# Patient Record
Sex: Male | Born: 1975 | Race: White | Hispanic: No | Marital: Married | Smoking: Never smoker
Health system: Southern US, Community
[De-identification: ages and names within clinical notes are randomized; demographics above are authoritative.]

## PROBLEM LIST (undated history)

## (undated) DIAGNOSIS — Z803 Family history of malignant neoplasm of breast: Secondary | ICD-10-CM

## (undated) DIAGNOSIS — Z8042 Family history of malignant neoplasm of prostate: Secondary | ICD-10-CM

## (undated) DIAGNOSIS — K219 Gastro-esophageal reflux disease without esophagitis: Secondary | ICD-10-CM

## (undated) DIAGNOSIS — Z800 Family history of malignant neoplasm of digestive organs: Secondary | ICD-10-CM

## (undated) HISTORY — DX: Family history of malignant neoplasm of prostate: Z80.42

## (undated) HISTORY — PX: VASECTOMY: SHX75

## (undated) HISTORY — DX: Family history of malignant neoplasm of breast: Z80.3

## (undated) HISTORY — DX: Family history of malignant neoplasm of digestive organs: Z80.0

## (undated) HISTORY — PX: OTHER SURGICAL HISTORY: SHX169

---

## 2002-08-16 ENCOUNTER — Ambulatory Visit (HOSPITAL_BASED_OUTPATIENT_CLINIC_OR_DEPARTMENT_OTHER): Admission: RE | Admit: 2002-08-16 | Discharge: 2002-08-16 | Payer: Self-pay | Admitting: Otolaryngology

## 2021-11-01 ENCOUNTER — Other Ambulatory Visit: Payer: Self-pay | Admitting: Urology

## 2021-11-01 DIAGNOSIS — R972 Elevated prostate specific antigen [PSA]: Secondary | ICD-10-CM

## 2021-11-20 ENCOUNTER — Other Ambulatory Visit: Payer: Self-pay

## 2021-11-20 ENCOUNTER — Ambulatory Visit
Admission: RE | Admit: 2021-11-20 | Discharge: 2021-11-20 | Disposition: A | Discharge status: Home / Self Care | Payer: Self-pay | Source: Ambulatory Visit | Attending: Urology | Admitting: Urology

## 2021-11-20 DIAGNOSIS — R972 Elevated prostate specific antigen [PSA]: Secondary | ICD-10-CM

## 2021-11-20 MED ORDER — GADOBENATE DIMEGLUMINE 529 MG/ML IV SOLN
20.0000 mL | Freq: Once | INTRAVENOUS | Status: AC | PRN
  Administered 2021-11-20: 20 mL via INTRAVENOUS

## 2021-12-24 ENCOUNTER — Ambulatory Visit: Payer: Self-pay | Admitting: Nurse Practitioner

## 2021-12-28 ENCOUNTER — Other Ambulatory Visit (HOSPITAL_COMMUNITY): Payer: Self-pay | Admitting: Urology

## 2021-12-28 DIAGNOSIS — C61 Malignant neoplasm of prostate: Secondary | ICD-10-CM

## 2022-01-07 ENCOUNTER — Other Ambulatory Visit (HOSPITAL_COMMUNITY): Payer: Self-pay | Admitting: Urology

## 2022-01-07 DIAGNOSIS — C61 Malignant neoplasm of prostate: Secondary | ICD-10-CM

## 2022-01-11 ENCOUNTER — Encounter (HOSPITAL_COMMUNITY): Payer: BC Managed Care – PPO

## 2022-01-13 ENCOUNTER — Encounter (HOSPITAL_COMMUNITY)
Admission: RE | Admit: 2022-01-13 | Discharge: 2022-01-13 | Disposition: A | Discharge status: Home / Self Care | Payer: BC Managed Care – PPO | Source: Ambulatory Visit | Attending: Urology | Admitting: Urology

## 2022-01-13 DIAGNOSIS — C61 Malignant neoplasm of prostate: Secondary | ICD-10-CM | POA: Insufficient documentation

## 2022-01-13 MED ORDER — TECHNETIUM TC 99M MEDRONATE IV KIT
20.0000 | PACK | Freq: Once | INTRAVENOUS | Status: AC | PRN
  Administered 2022-01-13: 21 via INTRAVENOUS

## 2022-01-24 ENCOUNTER — Other Ambulatory Visit: Payer: Self-pay | Admitting: Urology

## 2022-01-25 ENCOUNTER — Other Ambulatory Visit: Payer: Self-pay | Admitting: Urology

## 2022-02-25 NOTE — Progress Notes (Addendum)
Anesthesia Review:  PCP:  DR Janace Litten- Lov 07/27/21  Cardiologist : Chest x-ray : EKG : Echo : Stress test: Cardiac Cath :  Activity level:  Sleep Study/ CPAP : Fasting Blood Sugar :      / Checks Blood Sugar -- times a day:   Blood Thinner/ Instructions /Last Dose: ASA / Instructions/ Last Dose :   Phentermine - last dose 02/28/22 per pt

## 2022-02-28 NOTE — Progress Notes (Signed)
DUE TO COVID-19 ONLY  2  VISITOR IS ALLOWED TO COME WITH YOU AND STAY IN THE WAITING ROOM ONLY DURING PRE OP AND PROCEDURE DAY OF SURGERY.   4 VISITOR  MAY VISIT WITH YOU AFTER SURGERY IN YOUR PRIVATE ROOM DURING VISITING HOURS ONLY! YOU MAY HAVE ONE PERSON SPEND THE NITE WITH YOU IN YOUR ROOM AFTER SURGERY.    .    Your procedure is scheduled on:          03/10/2022   Report to [facility] Main  Entrance   Report to admitting at     0515             AM DO NOT [place], PICTURE ID OR WALLET DAY OF SURGERY.      Call this number if you have problems the morning of surgery [phone]    Clear liquid diet the day before surgery.   17 grams of Miralax in 4 ounces of water at 12 noon day before surgeyr.   Fleets enema nite before surgery.     REMEMBER: NO  SOLID FOODS , CANDY, GUM OR MINTS AFTER MIDNITE THE NITE BEFORE SURGERY .       Marland Kitchen CLEAR LIQUIDS UNTIL   0415             DAY OF SURGERY.         CLEAR LIQUID DIET   Foods Allowed      WATER BLACK COFFEE ( SUGAR OK, NO MILK, CREAM OR CREAMER) REGULAR AND DECAF  TEA ( SUGAR OK NO MILK, CREAM, OR CREAMER) REGULAR AND DECAF  PLAIN JELLO ( NO RED)  FRUIT ICES ( NO RED, NO FRUIT PULP)  POPSICLES ( NO RED)  JUICE- APPLE, WHITE GRAPE AND WHITE CRANBERRY  SPORT DRINK LIKE GATORADE ( NO RED)  CLEAR BROTH ( VEGETABLE , CHICKEN OR BEEF)                                                                     BRUSH YOUR TEETH MORNING OF SURGERY AND RINSE YOUR MOUTH OUT, NO CHEWING GUM CANDY OR MINTS.     Take these medicines the morning of surgery with A SIP OF WATER:  omeprazole    DO NOT TAKE ANY DIABETIC MEDICATIONS DAY OF YOUR SURGERY                               You may not have any metal on your body including hair pins and              piercings  Do not wear jewelry, make-up, lotions, powders or perfumes, deodorant             Do not wear nail polish on your fingernails.              IF YOU ARE A MALE AND  WANT TO SHAVE UNDER ARMS OR LEGS PRIOR TO SURGERY YOU MUST DO SO AT LEAST 48 HOURS PRIOR TO SURGERY.              Men may shave face and neck.   Do not bring valuables to the hospital. [facility] IS NOT  RESPONSIBLE   FOR VALUABLES.  Contacts, dentures or bridgework may not be worn into surgery.  Leave suitcase in the car. After surgery it may be brought to your room.     Patients discharged the day of surgery will not be allowed to drive home. IF YOU ARE HAVING SURGERY AND GOING HOME THE SAME DAY, YOU MUST HAVE AN ADULT TO DRIVE YOU HOME AND BE WITH YOU FOR 24 HOURS. YOU MAY GO HOME BY TAXI OR UBER OR ORTHERWISE, BUT AN ADULT MUST ACCOMPANY YOU HOME AND STAY WITH YOU FOR 24 HOURS.                Please read over the following fact sheets you were given: _____________________________________________________________________  [facility] - Preparing for Surgery Before surgery, you can play an important role.  Because skin is not sterile, your skin needs to be as free of germs as possible.  You can reduce the number of germs on your skin by washing with CHG (chlorahexidine gluconate) soap before surgery.  CHG is an antiseptic cleaner which kills germs and bonds with the skin to continue killing germs even after washing. Please DO NOT use if you have an allergy to CHG or antibacterial soaps.  If your skin becomes reddened/irritated stop using the CHG and inform your nurse when you arrive at Short Stay. Do not shave (including legs and underarms) for at least 48 hours prior to the first CHG shower.  You may shave your face/neck. Please follow these instructions carefully:  1.  Shower with CHG Soap the night before surgery and the  morning of Surgery.  2.  If you choose to wash your hair, wash your hair first as usual with your  normal  shampoo.  3.  After you shampoo, rinse your hair and body thoroughly to remove the  shampoo.                           4.  Use CHG as you would any other  liquid soap.  You can apply chg directly  to the skin and wash                       Gently with a scrungie or clean washcloth.  5.  Apply the CHG Soap to your body ONLY FROM THE NECK DOWN.   Do not use on face/ open                           Wound or open sores. Avoid contact with eyes, ears mouth and genitals (private parts).                       Wash face,  Genitals (private parts) with your normal soap.             6.  Wash thoroughly, paying special attention to the area where your surgery  will be performed.  7.  Thoroughly rinse your body with warm water from the neck down.  8.  DO NOT shower/wash with your normal soap after using and rinsing off  the CHG Soap.                9.  Pat yourself dry with a clean towel.            10.  Wear clean pajamas.  11.  Place clean sheets on your bed the night of your first shower and do not  sleep with pets. Day of Surgery : Do not apply any lotions/deodorants the morning of surgery.  Please wear clean clothes to the hospital/surgery center.  FAILURE TO FOLLOW THESE INSTRUCTIONS MAY RESULT IN THE CANCELLATION OF YOUR SURGERY PATIENT SIGNATURE_________________________________  NURSE SIGNATURE__________________________________  ________________________________________________________________________

## 2022-03-01 ENCOUNTER — Encounter (HOSPITAL_COMMUNITY): Payer: Self-pay

## 2022-03-01 ENCOUNTER — Other Ambulatory Visit: Payer: Self-pay

## 2022-03-01 ENCOUNTER — Encounter (HOSPITAL_COMMUNITY)
Admission: RE | Admit: 2022-03-01 | Discharge: 2022-03-01 | Disposition: A | Discharge status: Home / Self Care | Payer: BC Managed Care – PPO | Source: Ambulatory Visit | Attending: Urology | Admitting: Urology

## 2022-03-01 VITALS — BP 143/95 | HR 75 | Temp 97.9°F | Resp 16 | Ht 68.0 in | Wt 202.0 lb

## 2022-03-01 DIAGNOSIS — Z01818 Encounter for other preprocedural examination: Secondary | ICD-10-CM

## 2022-03-01 DIAGNOSIS — Z01812 Encounter for preprocedural laboratory examination: Secondary | ICD-10-CM | POA: Insufficient documentation

## 2022-03-01 HISTORY — DX: Gastro-esophageal reflux disease without esophagitis: K21.9

## 2022-03-01 LAB — BASIC METABOLIC PANEL
Anion gap: 7 (ref 5–15)
BUN: 12 mg/dL (ref 6–20)
CO2: 25 mmol/L (ref 22–32)
Calcium: 9.1 mg/dL (ref 8.9–10.3)
Chloride: 109 mmol/L (ref 98–111)
Creatinine, Ser: 1.01 mg/dL (ref 0.61–1.24)
GFR, Estimated: >60 mL/min (ref >60)
Glucose, Bld: 90 mg/dL (ref 70–99)
Potassium: 4 mmol/L (ref 3.5–5.1)
Sodium: 141 mmol/L (ref 135–145)

## 2022-03-01 LAB — CBC
HCT: 40.7 % (ref 39.0–52.0)
Hemoglobin: 14.3 g/dL (ref 13.0–17.0)
MCH: 31.2 pg (ref 26.0–34.0)
MCHC: 35.1 g/dL (ref 30.0–36.0)
MCV: 88.9 fL (ref 80.0–100.0)
Platelets: 255 10*3/uL (ref 150–400)
RBC: 4.58 MIL/uL (ref 4.22–5.81)
RDW: 11.9 % (ref 11.5–15.5)
WBC: 6.4 10*3/uL (ref 4.0–10.5)
nRBC: 0 % (ref 0.0–0.2)

## 2022-03-09 NOTE — H&P (Signed)
Office Visit Report     03/01/2022   --------------------------------------------------------------------------------   Craig Welch. Shaff  MRN: [id number]  DOB: [birth year], 46 year old Male  SSN: [id number]   PRIMARY CARE:  Herbie Baltimore A. Unk Lightning, MD  REFERRING:  Audree Camel. Unk Lightning, MD  PROVIDER:  Raynelle Bring, M.D.  TREATING:  Jiles Crocker, NP  LOCATION:  [facility] [phone]     --------------------------------------------------------------------------------   CC/HPI: CC: Prostate Cancer   Physician requesting consult: Dr. Jacalyn Lefevre  PCP: Dr. Janace Litten   [name] is a 46 year old gentleman who presented to a men's health clinic due to fatigue. His testosterone was 350 but his PSA was noted to be elevated at 5.3. He went to his PCP and this was repeated and remained elevated at 5.32. He was seen by Dr. Claudia Desanctis and an MRI of the prostate was performed on 11/20/21 that demonstrated a small 5 mm PI-RADS lesion of the left mid prostate. 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   Family history: None.   Imaging studies:  MRI (11/20/21): No EPE, SVI, LAD, or bone lesions.  CT abdomen/pelvis (01/10/2022): No evidence of metastatic disease  Bone scan (01/13/2022): Pending   PMH: He has a history of GERD.  PSH: No abdominal surgeries.   TNM stage: cT1c N0 M0  PSA: 5.32  Gleason score: 4+4=8 (GG4)  Biopsy (12/15/21): 5/15 cores positive  Left: L lateral mid (30%, 4+3=7), L mid (50%, 4+4=8)  Right: Benign  Targeted: 3/3 cores positive (30%, 4+4=8 and 30%, 30%, 4+3=7)  Prostate volume: 18 cc   Nomogram  OC disease: 65%  EPE: 33%  SVI: 5%  LNI: 8%  PFS (5 year, 10 year): 61%, 45%   Urinary function: IPSS is 9.  Erectile function: SHIM score is 20.   03/01/2022: Here today for preoperative appointment prior to  undergoing bilateral nerve sparing robot-assisted laparoscopic radical prostatectomy and bilateral pelvic lymph node dissection on 03/10/2022 with Dr Alinda Money. Patient has been seen numerous times by pelvic floor physical therapy in anticipation of the procedure. He reports being diligent with the exercises instructed at home. Patient denies any changes in past medical history, prescription medications taken on daily basis, no interval surgical or procedural interventions. Patient does endorse quite a bit of stress and anxiety in relation to the upcoming procedure. Over the past month he reports some weakening of his force of stream. Not associated with any dysuria or gross hematuria. He also mentions having some intermittent dizziness, feeling flushed as well as having some nausea while he is working. He is trying to drink at least 64 ounces of water per day. Of note patient is taking phentermine for the past 2 months for weight loss. These current symptoms began a couple of weeks ago. He denies any specific chest pain, shortness of breath, numbness or tingling in the extremities. He is having normal daily bowel movements.     ALLERGIES: No Known Allergies    MEDICATIONS: Nexium  Phentermine Hcl 37.5 mg capsule  Valacyclovir     GU PSH: Locm 300-'399Mg'$ /Ml Iodine,1Ml - 01/10/2022 Prostate Needle Biopsy - 12/15/2021     NON-GU PSH: Surgical Pathology, Gross And Microscopic Examination For Prostate Needle - 12/15/2021 Tonsillectomy     GU PMH: Stress Incontinence - 02/01/2022 Urinary Frequency - 01/18/2022, - 12/24/2021, - 10/22/2021 Prostate Cancer -  01/11/2022, - 01/10/2022, - 12/24/2021 Elevated PSA - 01/10/2022, - 12/15/2021, - 10/22/2021      PMH Notes:   1) Prostate cancer: He is s/p a BNS RAL radical prostatectomy and BPLND on 03/10/22.   Diagnosis:  Pretreatment PSA: 5.32  Pretreatment SHIM score: 20   NON-GU PMH: Muscle weakness (generalized) - 02/01/2022, - 01/18/2022 Other muscle spasm -  02/01/2022, - 01/18/2022 Constipation, unspecified - 01/18/2022    FAMILY HISTORY: Colon Cancer - Father Dementia - Mother Diabetes - Father Hypertension - Father   SOCIAL HISTORY: Marital Status: Married Preferred Language: English; Ethnicity: Not Hispanic Or Latino; Race: White Drinks 3 caffeinated drinks per day.    REVIEW OF SYSTEMS:    GU Review Male:   Patient denies frequent urination, hard to postpone urination, burning/ pain with urination, get up at night to urinate, leakage of urine, stream starts and stops, trouble starting your stream, have to strain to urinate , erection problems, and penile pain.  Gastrointestinal (Upper):   Patient reports nausea. Patient denies vomiting and indigestion/ heartburn.  Gastrointestinal (Lower):   Patient denies diarrhea and constipation.  Constitutional:   Patient denies fever, night sweats, weight loss, and fatigue.  Skin:   Patient denies skin rash/ lesion and itching.  Eyes:   Patient denies blurred vision and double vision.  Ears/ Nose/ Throat:   Patient denies sore throat and sinus problems.  Hematologic/Lymphatic:   Patient denies swollen glands and easy bruising.  Cardiovascular:   Patient denies leg swelling and chest pains.  Respiratory:   Patient denies cough and shortness of breath.  Endocrine:   Patient denies excessive thirst.  Musculoskeletal:   Patient denies back pain and joint pain.  Neurological:   Patient reports dizziness. Patient denies headaches.  Psychologic:   Patient denies depression and anxiety.   VITAL SIGNS:      03/01/2022 10:18 AM  Weight 213 lb / 96.62 kg  Height 68 in / 172.72 cm  BP 133/79 mmHg  Pulse 91 /min  Temperature 98.0 F / 36.6 C  BMI 32.4 kg/m   MULTI-SYSTEM PHYSICAL EXAMINATION:    Constitutional: Well-nourished. No physical deformities. Normally developed. Good grooming.  Neck: Neck symmetrical, not swollen. Normal tracheal position.  Respiratory: Normal breath sounds. No labored  breathing, no use of accessory muscles.   Cardiovascular: Regular rate and rhythm. No murmur, no gallop. Normal temperature, normal extremity pulses, no swelling, no varicosities.   Skin: No paleness, no jaundice, no cyanosis. No lesion, no ulcer, no rash.  Neurologic / Psychiatric: Oriented to time, oriented to place, oriented to person. No depression, no anxiety, no agitation.  Gastrointestinal: No mass, no tenderness, no rigidity, non obese abdomen.  Musculoskeletal: Normal gait and station of head and neck.     Complexity of Data:  Source Of History:  Patient, Family/Caregiver, Medical Record Summary  Lab Test Review:   PSA  Records Review:   Pathology Reports, Previous Doctor Records, Previous Hospital Records, Previous Patient Records  Urine Test Review:   Urinalysis  X-Ray Review: C.T. Abdomen/Pelvis: Reviewed Films. Reviewed Report.  Bone Scan: Reviewed Report.     03/01/22  Urinalysis  Urine Appearance Clear   Urine Color Straw   Urine Glucose Neg mg/dL  Urine Bilirubin Neg mg/dL  Urine Ketones Neg mg/dL  Urine Specific Gravity <=1.005   Urine Blood Neg ery/uL  Urine pH 5.5   Urine Protein Neg mg/dL  Urine Urobilinogen 0.2 mg/dL  Urine Nitrites Neg   Urine Leukocyte Esterase Neg leu/uL  PROCEDURES:          Urinalysis Dipstick Dipstick Cont'd  Color: Straw Bilirubin: Neg mg/dL  Appearance: Clear Ketones: Neg mg/dL  Specific Gravity: <=1.005 Blood: Neg ery/uL  pH: 5.5 Protein: Neg mg/dL  Glucose: Neg mg/dL Urobilinogen: 0.2 mg/dL    Nitrites: Neg    Leukocyte Esterase: Neg leu/uL    ASSESSMENT:      ICD-10 Details  1 GU:   Prostate Cancer - C61 Chronic, Threat to Bodily Function  2 NON-GU:   Encounter for other preprocedural examination - Z01.818 Undiagnosed New Problem   PLAN:           Orders Labs Urine Culture          Schedule Return Visit/Planned Activity: Keep Scheduled Appointment - Follow up MD, Schedule Surgery          Document Letter(s):   Created for Patient: Clinical Summary         Notes:   Reassurance provided. He is taking the weight loss drug phentermine which I think coupled with some underlying anxiety in relation to the upcoming procedure is resulting in him having some dizziness and nausea while at work. I did encourage eating a healthy diet, increasing hydration, he can also drink Gatorade to replenish electrolytes. At this time he is going to stop that medication.   All questions answered to the best of my ability regarding the upcoming procedure and expected postoperative course with understanding expressed by the patient. Urine culture sent today to serve as a baseline. He will proceed with previously scheduled prostatectomy on 6/15 with his urologist.        Next Appointment:      Next Appointment: 03/10/2022 07:15 AM    Appointment Type: Surgery     Location: [facility] [phone]    Provider: Raynelle Bring, M.D.    Reason for Visit: WL/EXT REC RA LAP RAD PROSTATECTOMY LEVEL 2, BPLA WITH AMANDA      * Signed by Jiles Crocker, NP on 03/01/22 at 10:55 AM (EDT)*

## 2022-03-09 NOTE — Anesthesia Preprocedure Evaluation (Addendum)
Anesthesia Evaluation  Patient identified by MRN, date of birth, ID band Patient awake    Reviewed: Allergy & Precautions, NPO status , Patient's Chart, lab work & pertinent test results  Airway Mallampati: II  TM Distance: >3 FB Neck ROM: Full    Dental no notable dental hx.    Pulmonary neg pulmonary ROS,    Pulmonary exam normal breath sounds clear to auscultation       Cardiovascular hypertension, Normal cardiovascular exam Rhythm:Regular Rate:Normal  Untreated HTN   Neuro/Psych negative neurological ROS  negative psych ROS   GI/Hepatic Neg liver ROS, GERD  Medicated,  Endo/Other  negative endocrine ROS  Renal/GU negative Renal ROS  negative genitourinary   Musculoskeletal negative musculoskeletal ROS (+)   Abdominal   Peds negative pediatric ROS (+)  Hematology negative hematology ROS (+)   Anesthesia Other Findings   Reproductive/Obstetrics negative OB ROS                            Anesthesia Physical Anesthesia Plan  ASA: 3  Anesthesia Plan: General   Post-op Pain Management: Toradol IV (intra-op)*   Induction: Intravenous  PONV Risk Score and Plan: 2 and Ondansetron, Dexamethasone and Treatment may vary due to age or medical condition  Airway Management Planned: Oral ETT  Additional Equipment:   Intra-op Plan:   Post-operative Plan: Extubation in OR  Informed Consent: I have reviewed the patients History and Physical, chart, labs and discussed the procedure including the risks, benefits and alternatives for the proposed anesthesia with the patient or authorized representative who has indicated his/her understanding and acceptance.     Dental advisory given  Plan Discussed with: CRNA and Surgeon  Anesthesia Plan Comments:        Anesthesia Quick Evaluation

## 2022-03-10 ENCOUNTER — Other Ambulatory Visit: Payer: Self-pay

## 2022-03-10 ENCOUNTER — Encounter (HOSPITAL_COMMUNITY): Payer: Self-pay | Admitting: Urology

## 2022-03-10 ENCOUNTER — Observation Stay (HOSPITAL_COMMUNITY)
Admission: RE | Admit: 2022-03-10 | Discharge: 2022-03-11 | Disposition: A | Discharge status: Home / Self Care | Payer: BC Managed Care – PPO | Source: Ambulatory Visit | Attending: Urology | Admitting: Urology

## 2022-03-10 ENCOUNTER — Encounter (HOSPITAL_COMMUNITY)
Admission: RE | Disposition: A | Discharge status: Home / Self Care | Payer: Self-pay | Source: Ambulatory Visit | Attending: Urology

## 2022-03-10 ENCOUNTER — Ambulatory Visit (HOSPITAL_COMMUNITY): Payer: BC Managed Care – PPO | Admitting: Anesthesiology

## 2022-03-10 DIAGNOSIS — C61 Malignant neoplasm of prostate: Secondary | ICD-10-CM | POA: Diagnosis present

## 2022-03-10 DIAGNOSIS — Z01818 Encounter for other preprocedural examination: Secondary | ICD-10-CM

## 2022-03-10 HISTORY — PX: ROBOT ASSISTED LAPAROSCOPIC RADICAL PROSTATECTOMY: SHX5141

## 2022-03-10 HISTORY — PX: LYMPHADENECTOMY: SHX5960

## 2022-03-10 LAB — TYPE AND SCREEN
ABO/RH(D): O POS
Antibody Screen: NEGATIVE

## 2022-03-10 LAB — SURGICAL PCR SCREEN
MRSA, PCR: NEGATIVE
Staphylococcus aureus: NEGATIVE

## 2022-03-10 LAB — HEMOGLOBIN AND HEMATOCRIT, BLOOD
HCT: 41.2 % (ref 39.0–52.0)
Hemoglobin: 14.4 g/dL (ref 13.0–17.0)

## 2022-03-10 LAB — ABO/RH: ABO/RH(D): O POS

## 2022-03-10 SURGERY — XI ROBOTIC ASSISTED LAPAROSCOPIC RADICAL PROSTATECTOMY LEVEL 2
Anesthesia: General

## 2022-03-10 MED ORDER — LIDOCAINE HCL (PF) 2 % IJ SOLN
INTRAMUSCULAR | Status: AC
  Filled 2022-03-10: qty 5

## 2022-03-10 MED ORDER — LABETALOL HCL 5 MG/ML IV SOLN
INTRAVENOUS | Status: AC
  Filled 2022-03-10: qty 4

## 2022-03-10 MED ORDER — ROCURONIUM BROMIDE 10 MG/ML (PF) SYRINGE
PREFILLED_SYRINGE | INTRAVENOUS | Status: AC
  Filled 2022-03-10: qty 10

## 2022-03-10 MED ORDER — PHENYLEPHRINE 80 MCG/ML (10ML) SYRINGE FOR IV PUSH (FOR BLOOD PRESSURE SUPPORT)
PREFILLED_SYRINGE | INTRAVENOUS | Status: AC
Start: 2022-03-10 — End: ?
  Filled 2022-03-10: qty 10

## 2022-03-10 MED ORDER — PANTOPRAZOLE SODIUM 40 MG PO TBEC
40.0000 mg | DELAYED_RELEASE_TABLET | Freq: Every day | ORAL | Status: DC
  Administered 2022-03-11: 40 mg via ORAL
  Filled 2022-03-10: qty 1

## 2022-03-10 MED ORDER — DOCUSATE SODIUM 100 MG PO CAPS
100.0000 mg | ORAL_CAPSULE | Freq: Two times a day (BID) | ORAL | Status: DC
  Administered 2022-03-10 – 2022-03-11 (×3): 100 mg via ORAL
  Filled 2022-03-10 (×3): qty 1

## 2022-03-10 MED ORDER — CHLORHEXIDINE GLUCONATE 0.12 % MT SOLN
15.0000 mL | Freq: Once | OROMUCOSAL | Status: AC
  Administered 2022-03-10: 15 mL via OROMUCOSAL

## 2022-03-10 MED ORDER — MIDAZOLAM HCL 5 MG/5ML IJ SOLN
INTRAMUSCULAR | Status: DC | PRN
  Administered 2022-03-10: 2 mg via INTRAVENOUS

## 2022-03-10 MED ORDER — ONDANSETRON HCL 4 MG/2ML IJ SOLN: 4.0000 mg | INTRAMUSCULAR | Status: DC | PRN

## 2022-03-10 MED ORDER — DEXAMETHASONE SODIUM PHOSPHATE 10 MG/ML IJ SOLN
INTRAMUSCULAR | Status: AC
  Filled 2022-03-10: qty 1

## 2022-03-10 MED ORDER — DIPHENHYDRAMINE HCL 50 MG/ML IJ SOLN: 12.5000 mg | Freq: Four times a day (QID) | INTRAMUSCULAR | Status: DC | PRN

## 2022-03-10 MED ORDER — ZOLPIDEM TARTRATE 5 MG PO TABS: 5.0000 mg | ORAL_TABLET | Freq: Every evening | ORAL | Status: DC | PRN

## 2022-03-10 MED ORDER — HEPARIN SODIUM (PORCINE) 1000 UNIT/ML IJ SOLN
INTRAMUSCULAR | Status: AC
Start: 2022-03-10 — End: ?
  Filled 2022-03-10: qty 1

## 2022-03-10 MED ORDER — LIDOCAINE HCL 2 % IJ SOLN
INTRAMUSCULAR | Status: AC
  Filled 2022-03-10: qty 20

## 2022-03-10 MED ORDER — KETOROLAC TROMETHAMINE 15 MG/ML IJ SOLN
15.0000 mg | Freq: Four times a day (QID) | INTRAMUSCULAR | Status: DC
  Administered 2022-03-10 – 2022-03-11 (×4): 15 mg via INTRAVENOUS
  Filled 2022-03-10 (×4): qty 1

## 2022-03-10 MED ORDER — LABETALOL HCL 5 MG/ML IV SOLN
INTRAVENOUS | Status: DC | PRN
  Administered 2022-03-10: 5 mg via INTRAVENOUS
  Administered 2022-03-10: 10 mg via INTRAVENOUS
  Administered 2022-03-10: 5 mg via INTRAVENOUS

## 2022-03-10 MED ORDER — DOCUSATE SODIUM 100 MG PO CAPS: 100.0000 mg | ORAL_CAPSULE | Freq: Two times a day (BID) | ORAL | Status: DC

## 2022-03-10 MED ORDER — FLEET ENEMA 7-19 GM/118ML RE ENEM: 1.0000 | ENEMA | Freq: Once | RECTAL | Status: DC

## 2022-03-10 MED ORDER — PROPOFOL 10 MG/ML IV BOLUS
INTRAVENOUS | Status: AC
Start: 2022-03-10 — End: ?
  Filled 2022-03-10: qty 20

## 2022-03-10 MED ORDER — MUPIROCIN 2 % EX OINT
1.0000 | TOPICAL_OINTMENT | Freq: Two times a day (BID) | CUTANEOUS | Status: DC
  Administered 2022-03-10: 1 via NASAL
  Filled 2022-03-10: qty 22

## 2022-03-10 MED ORDER — DOCUSATE SODIUM 100 MG PO CAPS: 100.0000 mg | ORAL_CAPSULE | Freq: Two times a day (BID) | ORAL | Status: AC

## 2022-03-10 MED ORDER — MORPHINE SULFATE (PF) 2 MG/ML IV SOLN
2.0000 mg | INTRAVENOUS | Status: DC | PRN
  Administered 2022-03-10: 2 mg via INTRAVENOUS
  Administered 2022-03-10: 4 mg via INTRAVENOUS
  Administered 2022-03-10 – 2022-03-11 (×3): 2 mg via INTRAVENOUS
  Filled 2022-03-10: qty 1
  Filled 2022-03-10: qty 2
  Filled 2022-03-10 (×3): qty 1

## 2022-03-10 MED ORDER — ONDANSETRON HCL 4 MG/2ML IJ SOLN
INTRAMUSCULAR | Status: AC
Start: 2022-03-10 — End: ?
  Filled 2022-03-10: qty 2

## 2022-03-10 MED ORDER — DIPHENHYDRAMINE HCL 12.5 MG/5ML PO ELIX: 12.5000 mg | ORAL_SOLUTION | Freq: Four times a day (QID) | ORAL | Status: DC | PRN

## 2022-03-10 MED ORDER — TRAMADOL HCL 50 MG PO TABS: 50.0000 mg | ORAL_TABLET | Freq: Four times a day (QID) | ORAL | 0 refills | Status: AC | PRN

## 2022-03-10 MED ORDER — SODIUM CHLORIDE 0.9 % IR SOLN
Status: DC | PRN
  Administered 2022-03-10: 1000 mL via INTRAVESICAL

## 2022-03-10 MED ORDER — ROCURONIUM BROMIDE 10 MG/ML (PF) SYRINGE
PREFILLED_SYRINGE | INTRAVENOUS | Status: DC | PRN
  Administered 2022-03-10: 30 mg via INTRAVENOUS
  Administered 2022-03-10: 100 mg via INTRAVENOUS
  Administered 2022-03-10: 20 mg via INTRAVENOUS

## 2022-03-10 MED ORDER — FENTANYL CITRATE (PF) 250 MCG/5ML IJ SOLN
INTRAMUSCULAR | Status: AC
  Filled 2022-03-10: qty 5

## 2022-03-10 MED ORDER — SODIUM CHLORIDE 0.9 % IV BOLUS
1000.0000 mL | Freq: Once | INTRAVENOUS | Status: AC
  Administered 2022-03-10: 1000 mL via INTRAVENOUS

## 2022-03-10 MED ORDER — MIDAZOLAM HCL 2 MG/2ML IJ SOLN
INTRAMUSCULAR | Status: AC
  Filled 2022-03-10: qty 2

## 2022-03-10 MED ORDER — CEFAZOLIN SODIUM-DEXTROSE 2-4 GM/100ML-% IV SOLN
2.0000 g | Freq: Once | INTRAVENOUS | Status: AC
  Administered 2022-03-10: 2 g via INTRAVENOUS
  Filled 2022-03-10: qty 100

## 2022-03-10 MED ORDER — POLYETHYLENE GLYCOL 3350 17 G PO PACK: 17.0000 g | PACK | Freq: Every day | ORAL | Status: DC

## 2022-03-10 MED ORDER — PHENTERMINE HCL 37.5 MG PO CAPS: 37.5000 mg | ORAL_CAPSULE | Freq: Every morning | ORAL | Status: DC

## 2022-03-10 MED ORDER — STERILE WATER FOR IRRIGATION IR SOLN
Status: DC | PRN
  Administered 2022-03-10: 1000 mL

## 2022-03-10 MED ORDER — ACETAMINOPHEN 325 MG PO TABS: 650.0000 mg | ORAL_TABLET | ORAL | Status: DC | PRN

## 2022-03-10 MED ORDER — BUPIVACAINE-EPINEPHRINE (PF) 0.25% -1:200000 IJ SOLN
INTRAMUSCULAR | Status: AC
  Filled 2022-03-10: qty 30

## 2022-03-10 MED ORDER — HYOSCYAMINE SULFATE 0.125 MG SL SUBL: 0.1250 mg | SUBLINGUAL_TABLET | Freq: Four times a day (QID) | SUBLINGUAL | Status: DC | PRN

## 2022-03-10 MED ORDER — LIDOCAINE HCL (PF) 2 % IJ SOLN
INTRAMUSCULAR | Status: DC | PRN
  Administered 2022-03-10: 1.5 mg/kg/h via INTRADERMAL

## 2022-03-10 MED ORDER — FENTANYL CITRATE (PF) 100 MCG/2ML IJ SOLN
INTRAMUSCULAR | Status: DC | PRN
Start: 2022-03-10 — End: 2022-03-10
  Administered 2022-03-10: 50 ug via INTRAVENOUS
  Administered 2022-03-10: 150 ug via INTRAVENOUS
  Administered 2022-03-10: 50 ug via INTRAVENOUS

## 2022-03-10 MED ORDER — ORAL CARE MOUTH RINSE: 15.0000 mL | Freq: Once | OROMUCOSAL | Status: AC

## 2022-03-10 MED ORDER — LACTATED RINGERS IV SOLN: INTRAVENOUS | Status: DC

## 2022-03-10 MED ORDER — TRIPLE ANTIBIOTIC 3.5-400-5000 EX OINT: TOPICAL_OINTMENT | Freq: Three times a day (TID) | CUTANEOUS | Status: DC | PRN

## 2022-03-10 MED ORDER — SULFAMETHOXAZOLE-TRIMETHOPRIM 800-160 MG PO TABS: 1.0000 | ORAL_TABLET | Freq: Two times a day (BID) | ORAL | 0 refills | Status: DC

## 2022-03-10 MED ORDER — KETOROLAC TROMETHAMINE 30 MG/ML IJ SOLN
INTRAMUSCULAR | Status: DC | PRN
  Administered 2022-03-10: 30 mg via INTRAVENOUS

## 2022-03-10 MED ORDER — DEXAMETHASONE SODIUM PHOSPHATE 4 MG/ML IJ SOLN
INTRAMUSCULAR | Status: DC | PRN
  Administered 2022-03-10: 8 mg via INTRAVENOUS

## 2022-03-10 MED ORDER — PHENYLEPHRINE 80 MCG/ML (10ML) SYRINGE FOR IV PUSH (FOR BLOOD PRESSURE SUPPORT)
PREFILLED_SYRINGE | INTRAVENOUS | Status: DC | PRN
  Administered 2022-03-10: 160 ug via INTRAVENOUS
  Administered 2022-03-10 (×2): 80 ug via INTRAVENOUS
  Administered 2022-03-10: 120 ug via INTRAVENOUS
  Administered 2022-03-10: 160 ug via INTRAVENOUS

## 2022-03-10 MED ORDER — KCL IN DEXTROSE-NACL 20-5-0.45 MEQ/L-%-% IV SOLN
INTRAVENOUS | Status: DC
Start: 2022-03-10 — End: 2022-03-11
  Filled 2022-03-10 (×5): qty 1000

## 2022-03-10 MED ORDER — OMEPRAZOLE MAGNESIUM 20 MG PO TBEC: 20.0000 mg | DELAYED_RELEASE_TABLET | Freq: Every day | ORAL | Status: DC

## 2022-03-10 MED ORDER — ONDANSETRON HCL 4 MG/2ML IJ SOLN
INTRAMUSCULAR | Status: DC | PRN
  Administered 2022-03-10: 4 mg via INTRAVENOUS

## 2022-03-10 MED ORDER — CEFAZOLIN SODIUM-DEXTROSE 1-4 GM/50ML-% IV SOLN
1.0000 g | Freq: Three times a day (TID) | INTRAVENOUS | Status: AC
  Administered 2022-03-10 (×2): 1 g via INTRAVENOUS
  Filled 2022-03-10 (×2): qty 50

## 2022-03-10 MED ORDER — LIDOCAINE 2% (20 MG/ML) 5 ML SYRINGE
INTRAMUSCULAR | Status: DC | PRN
  Administered 2022-03-10: 100 mg via INTRAVENOUS

## 2022-03-10 MED ORDER — LACTATED RINGERS IV SOLN
INTRAVENOUS | Status: DC | PRN
  Administered 2022-03-10: 1000 mL

## 2022-03-10 MED ORDER — PROPOFOL 10 MG/ML IV BOLUS
INTRAVENOUS | Status: DC | PRN
  Administered 2022-03-10: 200 mg via INTRAVENOUS

## 2022-03-10 MED ORDER — BUPIVACAINE-EPINEPHRINE 0.25% -1:200000 IJ SOLN
INTRAMUSCULAR | Status: DC | PRN
  Administered 2022-03-10: 30 mL

## 2022-03-10 MED ORDER — SUGAMMADEX SODIUM 500 MG/5ML IV SOLN
INTRAVENOUS | Status: DC | PRN
  Administered 2022-03-10: 366.4 mg via INTRAVENOUS

## 2022-03-10 SURGICAL SUPPLY — 64 items
APPLICATOR COTTON TIP 6 STRL (MISCELLANEOUS) ×2 IMPLANT
APPLICATOR COTTON TIP 6IN STRL (MISCELLANEOUS) ×3
BAG COUNTER SPONGE SURGICOUNT (BAG) IMPLANT
CATH FOLEY 2WAY SLVR 18FR 30CC (CATHETERS) ×3 IMPLANT
CATH ROBINSON RED A/P 16FR (CATHETERS) ×3 IMPLANT
CATH ROBINSON RED A/P 8FR (CATHETERS) ×3 IMPLANT
CATH TIEMANN FOLEY 18FR 5CC (CATHETERS) ×3 IMPLANT
CHLORAPREP W/TINT 26 (MISCELLANEOUS) ×3 IMPLANT
CLIP LIGATING HEM O LOK PURPLE (MISCELLANEOUS) ×6 IMPLANT
COVER SURGICAL LIGHT HANDLE (MISCELLANEOUS) ×3 IMPLANT
COVER TIP SHEARS 8 DVNC (MISCELLANEOUS) ×2 IMPLANT
COVER TIP SHEARS 8MM DA VINCI (MISCELLANEOUS) ×3
CUTTER ECHEON FLEX ENDO 45 340 (ENDOMECHANICALS) ×3 IMPLANT
DERMABOND ADVANCED (GAUZE/BANDAGES/DRESSINGS) ×1
DERMABOND ADVANCED .7 DNX12 (GAUZE/BANDAGES/DRESSINGS) ×2 IMPLANT
DRAIN CHANNEL RND F F (WOUND CARE) IMPLANT
DRAPE ARM DVNC X/XI (DISPOSABLE) ×8 IMPLANT
DRAPE COLUMN DVNC XI (DISPOSABLE) ×2 IMPLANT
DRAPE DA VINCI XI ARM (DISPOSABLE) ×8
DRAPE DA VINCI XI COLUMN (DISPOSABLE) ×3
DRAPE SURG IRRIG POUCH 19X23 (DRAPES) ×3 IMPLANT
DRSG TEGADERM 4X4.75 (GAUZE/BANDAGES/DRESSINGS) ×4 IMPLANT
ELECT PENCIL ROCKER SW 15FT (MISCELLANEOUS) ×3 IMPLANT
ELECT REM PT RETURN 15FT ADLT (MISCELLANEOUS) ×3 IMPLANT
GAUZE 4X4 16PLY ~~LOC~~+RFID DBL (SPONGE) ×3 IMPLANT
GAUZE SPONGE 4X4 12PLY STRL (GAUZE/BANDAGES/DRESSINGS) ×3 IMPLANT
GLOVE BIO SURGEON STRL SZ 6.5 (GLOVE) ×3 IMPLANT
GLOVE SURG LX 7.5 STRW (GLOVE) ×2
GLOVE SURG LX STRL 7.5 STRW (GLOVE) ×4 IMPLANT
GOWN SRG XL LVL 4 BRTHBL STRL (GOWNS) ×2 IMPLANT
GOWN STRL NON-REIN XL LVL4 (GOWNS) ×2
GOWN STRL REUS W/ TWL XL LVL3 (GOWN DISPOSABLE) ×2 IMPLANT
GOWN STRL REUS W/TWL XL LVL3 (GOWN DISPOSABLE) ×3
HOLDER FOLEY CATH W/STRAP (MISCELLANEOUS) ×3 IMPLANT
IRRIG SUCT STRYKERFLOW 2 WTIP (MISCELLANEOUS) ×3
IRRIGATION SUCT STRKRFLW 2 WTP (MISCELLANEOUS) ×2 IMPLANT
IV LACTATED RINGERS 1000ML (IV SOLUTION) ×3 IMPLANT
KIT TURNOVER KIT A (KITS) IMPLANT
NDL SAFETY ECLIPSE 18X1.5 (NEEDLE) ×2 IMPLANT
NEEDLE HYPO 18GX1.5 SHARP (NEEDLE) ×2
PACK ROBOT UROLOGY CUSTOM (CUSTOM PROCEDURE TRAY) ×3 IMPLANT
RELOAD STAPLE 45 4.1 GRN THCK (STAPLE) ×2 IMPLANT
SEAL CANN UNIV 5-8 DVNC XI (MISCELLANEOUS) ×8 IMPLANT
SEAL XI 5MM-8MM UNIVERSAL (MISCELLANEOUS) ×12
SET TUBE SMOKE EVAC HIGH FLOW (TUBING) ×3 IMPLANT
SOLUTION ELECTROLUBE (MISCELLANEOUS) ×3 IMPLANT
SPIKE FLUID TRANSFER (MISCELLANEOUS) ×3 IMPLANT
STAPLE RELOAD 45 GRN (STAPLE) ×2 IMPLANT
STAPLE RELOAD 45MM GREEN (STAPLE) ×2
SUT ETHILON 3 0 PS 1 (SUTURE) ×3 IMPLANT
SUT MNCRL 3 0 RB1 (SUTURE) ×2 IMPLANT
SUT MNCRL 3 0 VIOLET RB1 (SUTURE) ×2 IMPLANT
SUT MNCRL AB 4-0 PS2 18 (SUTURE) ×6 IMPLANT
SUT MONOCRYL 3 0 RB1 (SUTURE) ×6
SUT PDS PLUS 0 (SUTURE) ×6
SUT PDS PLUS AB 0 CT-2 (SUTURE) ×4 IMPLANT
SUT VIC AB 0 CT1 27 (SUTURE) ×6
SUT VIC AB 0 CT1 27XBRD ANTBC (SUTURE) ×4 IMPLANT
SUT VIC AB 2-0 SH 27 (SUTURE) ×3
SUT VIC AB 2-0 SH 27X BRD (SUTURE) ×2 IMPLANT
SYR 27GX1/2 1ML LL SAFETY (SYRINGE) ×3 IMPLANT
TOWEL OR NON WOVEN STRL DISP B (DISPOSABLE) ×3 IMPLANT
TROCAR Z-THREAD FIOS 5X100MM (TROCAR) IMPLANT
WATER STERILE IRR 1000ML POUR (IV SOLUTION) ×3 IMPLANT

## 2022-03-10 NOTE — Discharge Instructions (Signed)

## 2022-03-10 NOTE — Op Note (Signed)
Preoperative diagnosis: Clinically localized adenocarcinoma of the prostate (clinical stage T1c Nx Mx)  Postoperative diagnosis: Clinically localized adenocarcinoma of the prostate (clinical stage T1c Nx Mx)  Procedure:  Robotic assisted laparoscopic radical prostatectomy (bilateral nerve sparing) Bilateral robotic assisted laparoscopic pelvic lymphadenectomy  Surgeon: Pryor Curia. M.D.  Assistant: Debbrah Alar, PA-C  An assistant was required for this surgical procedure.  The duties of the assistant included but were not limited to suctioning, passing suture, camera manipulation, retraction. This procedure would not be able to be performed without an Environmental consultant.  Resident: Dr. Aldine Contes  Anesthesia: General  Complications: None  EBL: 50 mL  IVF:  1200 mL crystalloid  Specimens: Prostate and seminal vesicles Right pelvic lymph nodes Left pelvic lymph nodes  Disposition of specimens: Pathology  Drains: 20 Fr coude catheter # 19 Blake pelvic drain  Indication: Craig Welch is a 46 y.o. year old patient with clinically localized prostate cancer.  After a thorough review of the management options for treatment of prostate cancer, he elected to proceed with surgical therapy and the above procedure(s).  We have discussed the potential benefits and risks of the procedure, side effects of the proposed treatment, the likelihood of the patient achieving the goals of the procedure, and any potential problems that might occur during the procedure or recuperation. Informed consent has been obtained.  Description of procedure:  The patient was taken to the operating room and a general anesthetic was administered. He was given preoperative antibiotics, placed in the dorsal lithotomy position, and prepped and draped in the usual sterile fashion. Next a preoperative timeout was performed. A urethral catheter was placed into the bladder and a site was selected near the umbilicus  for placement of the camera port. This was placed using a standard open Hassan technique which allowed entry into the peritoneal cavity under direct vision and without difficulty. An 8 mm robotic port was placed and a pneumoperitoneum established. The camera was then used to inspect the abdomen and there was no evidence of any intra-abdominal injuries or other abnormalities. The remaining abdominal ports were then placed. 8 mm robotic ports were placed in the right lower quadrant, left lower quadrant, and far left lateral abdominal wall. A 5 mm port was placed in the right upper quadrant and a 12 mm port was placed in the right lateral abdominal wall for laparoscopic assistance. All ports were placed under direct vision without difficulty. The surgical cart was then docked.   Utilizing the cautery scissors, the bladder was reflected posteriorly allowing entry into the space of Retzius and identification of the endopelvic fascia and prostate. The periprostatic fat was then removed from the prostate allowing full exposure of the endopelvic fascia. The endopelvic fascia was then incised from the apex back to the base of the prostate bilaterally and the underlying levator muscle fibers were swept laterally off the prostate thereby isolating the dorsal venous complex. There was an accessory left obturator artery that was preserved. The dorsal vein was then stapled and divided with a 45 mm Flex Echelon stapler. Attention then turned to the bladder neck which was divided anteriorly thereby allowing entry into the bladder and exposure of the urethral catheter. The catheter balloon was deflated and the catheter was brought into the operative field and used to retract the prostate anteriorly. The posterior bladder neck was then examined and was divided allowing further dissection between the bladder and prostate posteriorly until the vasa deferentia and seminal vessels were identified. The  vasa deferentia were isolated,  divided, and lifted anteriorly. The seminal vesicles were dissected down to their tips with care to control the seminal vascular arterial blood supply. These structures were then lifted anteriorly and the space between Denonvillier's fascia and the anterior rectum was developed with a combination of sharp and blunt dissection. This isolated the vascular pedicles of the prostate.  The lateral prostatic fascia was then sharply incised allowing release of the neurovascular bundles bilaterally. The vascular pedicles of the prostate were then ligated with Weck clips between the prostate and neurovascular bundles and divided with sharp cold scissor dissection resulting in neurovascular bundle preservation. The neurovascular bundles were then separated off the apex of the prostate and urethra bilaterally.  The urethra was then sharply transected allowing the prostate specimen to be disarticulated. The pelvis was copiously irrigated and hemostasis was ensured. There was no evidence for rectal injury.  Attention then turned to the right pelvic sidewall. The fibrofatty tissue between the external iliac vein, confluence of the iliac vessels, hypogastric artery, and Cooper's ligament was dissected free from the pelvic sidewall with care to preserve the obturator nerve. Weck clips were used for lymphostasis and hemostasis. An identical procedure was performed on the contralateral side and the lymphatic packets were removed for permanent pathologic analysis.  Attention then turned to the urethral anastomosis. A 2-0 Vicryl slip knot was placed between Denonvillier's fascia, the posterior bladder neck, and the posterior urethra to reapproximate these structures. A double-armed 3-0 Monocryl suture was then used to perform a 360 running tension-free anastomosis between the bladder neck and urethra. A new urethral catheter was then placed into the bladder and irrigated. There were no blood clots within the bladder and the  anastomosis appeared to be watertight. A #19 Blake drain was then brought through the left lateral 8 mm port site and positioned appropriately within the pelvis. It was secured to the skin with a nylon suture. The surgical cart was then undocked. The right lateral 12 mm port site was closed at the fascial level with a 0 Vicryl suture placed laparoscopically. All remaining ports were then removed under direct vision. The prostate specimen was removed intact within the Endopouch retrieval bag via the periumbilical camera port site. This fascial opening was closed with two running 0 PDS sutures. 0.25% Marcaine was then injected into all port sites and all incisions were reapproximated at the skin level with 4-0 Monocryl subcuticular sutures and Dermabond. The patient appeared to tolerate the procedure well and without complications. The patient was able to be extubated and transferred to the recovery unit in satisfactory condition.   Pryor Curia MD

## 2022-03-10 NOTE — Interval H&P Note (Signed)
History and Physical Interval Note:  03/10/2022 6:51 AM  Craig Welch  has presented today for surgery, with the diagnosis of PROSTATE CANCER.  The various methods of treatment have been discussed with the patient and family. After consideration of risks, benefits and other options for treatment, the patient has consented to  Procedure(s): XI ROBOTIC ASSISTED LAPAROSCOPIC RADICAL PROSTATECTOMY LEVEL 2 (N/A) LYMPHADENECTOMY, PELVIC (Bilateral) as a surgical intervention.  The patient's history has been reviewed, patient examined, no change in status, stable for surgery.  I have reviewed the patient's chart and labs.  Questions were answered to the patient's satisfaction.     [employer]

## 2022-03-10 NOTE — Progress Notes (Signed)
Patient ID: Craig Welch, male   DOB: 16-Sep-1976, 46 y.o.   MRN: [id number]  Post-op note  Subjective: The patient is doing well.  No complaints.  Objective: Vital signs in last 24 hours: Temp:  [97.7 F (36.5 C)-98.3 F (36.8 C)] 98.2 F (36.8 C) (06/15 1159) Pulse Rate:  [82-101] 101 (06/15 1600) Resp:  [10-18] 18 (06/15 1600) BP: (134-151)/(83-110) 144/98 (06/15 1600) SpO2:  [93 %-100 %] 96 % (06/15 1600) Weight:  [91.6 kg] 91.6 kg (06/15 0548)  Intake/Output from previous day: No intake/output data recorded. Intake/Output this shift: Total I/O In: 2493 [P.O.:720; I.V.:1723; IV Piggyback:50] Out: 1200 [Urine:1150; Blood:50]  Physical Exam:  General: Alert and oriented. Abdomen: Soft, Nondistended. Incisions: Clean and dry. GU: Urine clear.  Lab Results: Recent Labs    03/10/22 1033  HGB 14.4  HCT 41.2    Assessment/Plan: POD#0   1) Continue to monitor, ambulate, IS   Craig Welch. MD   LOS: 0 days   Craig Welch 03/10/2022, 4:28 PM

## 2022-03-10 NOTE — Transfer of Care (Signed)
Immediate Anesthesia Transfer of Care Note  Patient: DEREN DEGRAZIA  Procedure(s) Performed: Procedure(s): XI ROBOTIC ASSISTED LAPAROSCOPIC RADICAL PROSTATECTOMY LEVEL 2 (N/A) LYMPHADENECTOMY, PELVIC (Bilateral)  Patient Location: PACU  Anesthesia Type:General  Level of Consciousness: Patient easily awoken, sedated, comfortable, cooperative, following commands, responds to stimulation.   Airway & Oxygen Therapy: Patient spontaneously breathing, ventilating well, oxygen via simple oxygen mask.  Post-op Assessment: Report given to PACU RN, vital signs reviewed and stable, moving all extremities.   Post vital signs: Reviewed and stable.  Complications: No apparent anesthesia complications  Last Vitals:  Vitals Value Taken Time  BP 146/110 03/10/22 1019  Temp    Pulse 89 03/10/22 1021  Resp 16 03/10/22 1021  SpO2 100 % 03/10/22 1021  Vitals shown include unvalidated device data.  Last Pain:  Vitals:   03/10/22 0548  PainSc: 0-No pain         Complications: No notable events documented.

## 2022-03-10 NOTE — Anesthesia Procedure Notes (Signed)
Procedure Name: Intubation Date/Time: 03/10/2022 7:27 AM  Performed by: Deliah Boston, CRNAPre-anesthesia Checklist: Patient identified, Emergency Drugs available, Suction available and Patient being monitored Patient Re-evaluated:Patient Re-evaluated prior to induction Oxygen Delivery Method: Circle system utilized Preoxygenation: Pre-oxygenation with 100% oxygen Induction Type: IV induction Ventilation: Two handed mask ventilation required Laryngoscope Size: Mac and 4 Grade View: Grade II Tube type: Oral Tube size: 7.5 mm Number of attempts: 1 Airway Equipment and Method: Stylet and Oral airway Placement Confirmation: ETT inserted through vocal cords under direct vision, positive ETCO2 and breath sounds checked- equal and bilateral Secured at: 23 cm Tube secured with: Tape Dental Injury: Teeth and Oropharynx as per pre-operative assessment

## 2022-03-10 NOTE — Anesthesia Postprocedure Evaluation (Signed)
Anesthesia Post Note  Patient: Craig Welch  Procedure(s) Performed: XI ROBOTIC ASSISTED LAPAROSCOPIC RADICAL PROSTATECTOMY LEVEL 2 LYMPHADENECTOMY, PELVIC (Bilateral)     Patient location during evaluation: PACU Anesthesia Type: General Level of consciousness: awake and alert Pain management: pain level controlled Vital Signs Assessment: post-procedure vital signs reviewed and stable Respiratory status: spontaneous breathing, nonlabored ventilation, respiratory function stable and patient connected to nasal cannula oxygen Cardiovascular status: blood pressure returned to baseline and stable Postop Assessment: no apparent nausea or vomiting Anesthetic complications: no   No notable events documented.  Last Vitals:  Vitals:   03/10/22 1034 03/10/22 1049  BP: (!) 148/96 (!) 140/96  Pulse: 87 87  Resp: 15 11  Temp:  36.5 C  SpO2: 93% 96%    Last Pain:  Vitals:   03/10/22 1049  PainSc: 0-No pain                 Aashrith Eves S

## 2022-03-11 ENCOUNTER — Encounter (HOSPITAL_COMMUNITY): Payer: Self-pay | Admitting: Urology

## 2022-03-11 DIAGNOSIS — C61 Malignant neoplasm of prostate: Secondary | ICD-10-CM | POA: Diagnosis not present

## 2022-03-11 LAB — HEMOGLOBIN AND HEMATOCRIT, BLOOD
HCT: 43.7 % (ref 39.0–52.0)
Hemoglobin: 14.9 g/dL (ref 13.0–17.0)

## 2022-03-11 MED ORDER — TRAMADOL HCL 50 MG PO TABS: 50.0000 mg | ORAL_TABLET | Freq: Four times a day (QID) | ORAL | Status: DC | PRN

## 2022-03-11 MED ORDER — CHLORHEXIDINE GLUCONATE CLOTH 2 % EX PADS: 6.0000 | MEDICATED_PAD | Freq: Every day | CUTANEOUS | Status: DC

## 2022-03-11 MED ORDER — BISACODYL 10 MG RE SUPP
10.0000 mg | Freq: Once | RECTAL | Status: AC
  Administered 2022-03-11: 10 mg via RECTAL
  Filled 2022-03-11: qty 1

## 2022-03-11 NOTE — Plan of Care (Signed)
  Problem: Education: Goal: Knowledge of General Education information will improve Description: Including pain rating scale, medication(s)/side effects and non-pharmacologic comfort measures Outcome: Progressing   Problem: Health Behavior/Discharge Planning: Goal: Ability to manage health-related needs will improve Outcome: Progressing   Problem: Clinical Measurements: Goal: Ability to maintain clinical measurements within normal limits will improve Outcome: Progressing Goal: Will remain free from infection Outcome: Progressing Goal: Diagnostic test results will improve Outcome: Progressing Goal: Respiratory complications will improve Outcome: Progressing Goal: Cardiovascular complication will be avoided Outcome: Progressing   Problem: Activity: Goal: Risk for activity intolerance will decrease Outcome: Progressing   Problem: Nutrition: Goal: Adequate nutrition will be maintained Outcome: Progressing   Problem: Coping: Goal: Level of anxiety will decrease Outcome: Progressing   Problem: Elimination: Goal: Will not experience complications related to urinary retention Outcome: Progressing   Problem: Pain Managment: Goal: General experience of comfort will improve Outcome: Progressing   Problem: Safety: Goal: Ability to remain free from injury will improve Outcome: Progressing   Problem: Skin Integrity: Goal: Risk for impaired skin integrity will decrease Outcome: Progressing   Problem: Bowel/Gastric: Goal: Gastrointestinal status for postoperative course will improve Outcome: Progressing   Problem: Pain Management: Goal: General experience of comfort will improve Outcome: Progressing   Problem: Skin Integrity: Goal: Demonstration of wound healing without infection will improve Outcome: Progressing   Problem: Urinary Elimination: Goal: Ability to avoid or minimize complications of infection will improve Outcome: Progressing Goal: Ability to achieve and  maintain urine output will improve Outcome: Progressing Goal: Home care management will improve Outcome: Progressing   Problem: Education: Goal: Knowledge of the procedure and recovery process will improve Outcome: Completed/Met

## 2022-03-11 NOTE — TOC Progression Note (Signed)
Transition of Care [facility]) - Progression Note    Patient Details  Name: Craig Welch MRN: [id number] Date of Birth: 05/14/1976  Transition of Care [facility]) CM/SW Contact  Purcell Mouton, RN Phone Number: 03/11/2022, 2:52 PM  Clinical Narrative:      Transition of Care (TOC) Screening Note   Patient Details  Name: Craig Welch Date of Birth: 17-Feb-1976   Transition of Care [facility]) CM/SW Contact:    Purcell Mouton, RN Phone Number: 03/11/2022, 2:52 PM    Transition of Care Department [facility]) has reviewed patient and no TOC needs have been identified at this time. We will continue to monitor patient advancement through interdisciplinary progression rounds. If new patient transition needs arise, please place a TOC consult.         Expected Discharge Plan and Services           Expected Discharge Date: 03/11/22                                     Social Determinants of Health (SDOH) Interventions    Readmission Risk Interventions     No data to display

## 2022-03-11 NOTE — Progress Notes (Signed)
Patient ID: Craig Welch, male   DOB: Mar 01, 1976, 46 y.o.   MRN: [id number]  1 Day Post-Op Subjective: The patient is doing well.  No nausea or vomiting. Pain is adequately controlled.  Objective: Vital signs in last 24 hours: Temp:  [97.7 F (36.5 C)-98.8 F (37.1 C)] 98.1 F (36.7 C) (06/16 0424) Pulse Rate:  [82-110] 104 (06/16 0424) Resp:  [10-18] 16 (06/16 0424) BP: (131-148)/(74-110) 131/78 (06/16 0424) SpO2:  [93 %-100 %] 97 % (06/16 0424) Weight:  [91.6 kg] 91.6 kg (06/15 1824)  Intake/Output from previous day: 06/15 0701 - 06/16 0700 In: 3955.9 [P.O.:720; I.V.:3135.9; IV Piggyback:100] Out: 3016 [WFUXN:2355; Drains:135; Blood:50] Intake/Output this shift: No intake/output data recorded.  Physical Exam:  General: Alert and oriented. CV: RRR Lungs: Clear bilaterally. GI: Soft, Nondistended. Incisions: Clean, dry, and intact Urine: Clear Extremities: Nontender, no erythema, no edema.  Lab Results: Recent Labs    03/10/22 1033 03/11/22 0343  HGB 14.4 14.9  HCT 41.2 43.7      Assessment/Plan: POD# 1 s/p robotic prostatectomy.  1) SL IVF 2) Ambulate, Incentive spirometry 3) Transition to oral pain medication 4) Dulcolax suppository 5) D/C pelvic drain 6) Plan for likely discharge later today   Craig Welch. MD   LOS: 0 days   Craig Welch 03/11/2022, 7:27 AM

## 2022-03-11 NOTE — Progress Notes (Incomplete)
RN provided foley and leg bag teaching to patient and wife who both stated understanding.   RN reviewed discharge paperwork with patient who stated understanding.  Patient in possession of all personal belongings.

## 2022-03-11 NOTE — Discharge Summary (Signed)
Date of admission: 03/10/2022  Date of discharge: 03/11/2022  Admission diagnosis: Prostate cancer   Discharge diagnosis: Prostate cancer   Secondary diagnoses:  Patient Active Problem List   Diagnosis Date Noted   Prostate cancer ([place]) 03/10/2022    Procedures performed: Procedure(s): XI ROBOTIC ASSISTED LAPAROSCOPIC RADICAL PROSTATECTOMY LEVEL 2 LYMPHADENECTOMY, PELVIC  History and Physical: For full details, please see admission history and physical. Briefly, Craig Welch is a 46 y.o. year old patient with history of prostate cancer who presented for robotic radical prostatectomy with bilateral pelvic lymph node dissection.   Hospital Course: Patient tolerated the procedure well.  He was then transferred to the floor after an uneventful PACU stay.  His hospital course was uncomplicated.  He began ambulating overnight POD0. He tolerated a clear liquid diet without nausea or vomiting. His POD1 am Hgb was stable. On POD#1 he had met discharge criteria: was tolerating clears, was up and ambulating independently,  pain was well controlled, catheter was draining without issues, and was ready to for discharge.  He has follow up on 6/23 for catheter removal and pathology discussion.    Laboratory values:  Recent Labs    03/10/22 1033 03/11/22 0343  HGB 14.4 14.9  HCT 41.2 43.7   No results for input(s): "NA", "K", "CL", "CO2", "GLUCOSE", "BUN", "CREATININE", "CALCIUM" in the last 72 hours. No results for input(s): "LABPT", "INR" in the last 72 hours. No results for input(s): "LABURIN" in the last 72 hours. Results for orders placed or performed during the hospital encounter of 03/10/22  Surgical PCR screen     Status: None   Collection Time: 03/10/22  9:30 PM   Specimen: Nasal Mucosa; Nasal Swab  Result Value Ref Range Status   MRSA, PCR NEGATIVE NEGATIVE Final   Staphylococcus aureus NEGATIVE NEGATIVE Final    Comment: (NOTE) The Xpert SA Assay (FDA approved for NASAL  specimens in patients 68 years of age and older), is one component of a comprehensive surveillance program. It is not intended to diagnose infection nor to guide or monitor treatment. Performed at [facility], [place] [address], [place], [place] [address]     Disposition: Home  Discharge instruction: The patient was instructed to be ambulatory but told to refrain from heavy lifting, strenuous activity, or driving.   Discharge medications:  Allergies as of 03/11/2022   No Known Allergies      Medication List     TAKE these medications    docusate sodium 100 MG capsule Commonly known as: COLACE Take 1 capsule (100 mg total) by mouth 2 (two) times daily.   omeprazole 20 MG tablet Commonly known as: PRILOSEC OTC Take 20 mg by mouth daily.   phentermine 37.5 MG capsule Take 37.5 mg by mouth every morning.   sulfamethoxazole-trimethoprim 800-160 MG tablet Commonly known as: BACTRIM DS Take 1 tablet by mouth 2 (two) times daily. Start the day prior to foley removal appointment   traMADol 50 MG tablet Commonly known as: Ultram Take 1-2 tablets (50-100 mg total) by mouth every 6 (six) hours as needed for moderate pain or severe pain.   valACYclovir 500 MG tablet Commonly known as: VALTREX Take 500 mg by mouth daily.        Followup:   Follow-up Information     Raynelle Bring, MD Follow up on 03/18/2022.   Specialty: Urology Why: at 12:30 Contact information: [place]  [id number] [phone]

## 2022-03-11 NOTE — Plan of Care (Signed)
Problem: Education: Goal: Knowledge of General Education information will improve Description: Including pain rating scale, medication(s)/side effects and non-pharmacologic comfort measures 03/11/2022 1301 by Elizebeth Brooking, RN Outcome: Adequate for Discharge 03/11/2022 0918 by Elizebeth Brooking, RN Outcome: Progressing   Problem: Health Behavior/Discharge Planning: Goal: Ability to manage health-related needs will improve 03/11/2022 1301 by Elizebeth Brooking, RN Outcome: Adequate for Discharge 03/11/2022 0918 by Elizebeth Brooking, RN Outcome: Progressing   Problem: Clinical Measurements: Goal: Ability to maintain clinical measurements within normal limits will improve 03/11/2022 1301 by Elizebeth Brooking, RN Outcome: Adequate for Discharge 03/11/2022 0918 by Elizebeth Brooking, RN Outcome: Progressing Goal: Will remain free from infection 03/11/2022 1301 by Elizebeth Brooking, RN Outcome: Adequate for Discharge 03/11/2022 0918 by Elizebeth Brooking, RN Outcome: Progressing Goal: Diagnostic test results will improve 03/11/2022 1301 by Elizebeth Brooking, RN Outcome: Adequate for Discharge 03/11/2022 0918 by Elizebeth Brooking, RN Outcome: Progressing Goal: Respiratory complications will improve 03/11/2022 1301 by Elizebeth Brooking, RN Outcome: Adequate for Discharge 03/11/2022 0918 by Elizebeth Brooking, RN Outcome: Progressing Goal: Cardiovascular complication will be avoided 03/11/2022 1301 by Elizebeth Brooking, RN Outcome: Adequate for Discharge 03/11/2022 0918 by Elizebeth Brooking, RN Outcome: Progressing   Problem: Activity: Goal: Risk for activity intolerance will decrease 03/11/2022 1301 by Elizebeth Brooking, RN Outcome: Adequate for Discharge 03/11/2022 [phone] by Elizebeth Brooking, RN Outcome: Progressing   Problem: Nutrition: Goal: Adequate nutrition will be maintained 03/11/2022 1301 by Elizebeth Brooking,  RN Outcome: Adequate for Discharge 03/11/2022 0918 by Elizebeth Brooking, RN Outcome: Progressing   Problem: Coping: Goal: Level of anxiety will decrease 03/11/2022 1301 by Elizebeth Brooking, RN Outcome: Adequate for Discharge 03/11/2022 0918 by Elizebeth Brooking, RN Outcome: Progressing   Problem: Elimination: Goal: Will not experience complications related to bowel motility Outcome: Adequate for Discharge Goal: Will not experience complications related to urinary retention 03/11/2022 1301 by Elizebeth Brooking, RN Outcome: Adequate for Discharge 03/11/2022 0918 by Elizebeth Brooking, RN Outcome: Progressing   Problem: Pain Managment: Goal: General experience of comfort will improve 03/11/2022 1301 by Elizebeth Brooking, RN Outcome: Adequate for Discharge 03/11/2022 0918 by Elizebeth Brooking, RN Outcome: Progressing   Problem: Safety: Goal: Ability to remain free from injury will improve 03/11/2022 1301 by Elizebeth Brooking, RN Outcome: Adequate for Discharge 03/11/2022 0918 by Elizebeth Brooking, RN Outcome: Progressing   Problem: Skin Integrity: Goal: Risk for impaired skin integrity will decrease 03/11/2022 1301 by Elizebeth Brooking, RN Outcome: Adequate for Discharge 03/11/2022 0918 by Elizebeth Brooking, RN Outcome: Progressing   Problem: Bowel/Gastric: Goal: Gastrointestinal status for postoperative course will improve 03/11/2022 1301 by Elizebeth Brooking, RN Outcome: Adequate for Discharge 03/11/2022 0918 by Elizebeth Brooking, RN Outcome: Progressing   Problem: Pain Management: Goal: General experience of comfort will improve 03/11/2022 1301 by Elizebeth Brooking, RN Outcome: Adequate for Discharge 03/11/2022 0918 by Elizebeth Brooking, RN Outcome: Progressing   Problem: Skin Integrity: Goal: Demonstration of wound healing without infection will improve 03/11/2022 1301 by Elizebeth Brooking, RN Outcome: Adequate  for Discharge 03/11/2022 0918 by Elizebeth Brooking, RN Outcome: Progressing   Problem: Urinary Elimination: Goal: Ability to avoid or minimize complications of infection will improve 03/11/2022 1301 by Elizebeth Brooking, RN Outcome: Adequate for Discharge 03/11/2022 0918 by Elizebeth Brooking, RN Outcome: Progressing Goal: Ability to achieve and maintain urine output will improve 03/11/2022 1301 by Nelia Shi  W, RN Outcome: Adequate for Discharge 03/11/2022 0981 by Elizebeth Brooking, RN Outcome: Progressing Goal: Home care management will improve 03/11/2022 1301 by Elizebeth Brooking, RN Outcome: Adequate for Discharge 03/11/2022 1914 by Elizebeth Brooking, RN Outcome: Progressing   Problem: Education: Goal: Knowledge of the procedure and recovery process will improve Outcome: Completed/Met

## 2022-03-11 NOTE — Progress Notes (Signed)
Patient walked around [facility] 4.5 times throughout the nightshift and tolerated well. Patient still has not passed any gas, but patient's stomach is grumbling.

## 2022-03-11 NOTE — Plan of Care (Signed)
  Problem: Education: Goal: Knowledge of General Education information will improve Description: Including pain rating scale, medication(s)/side effects and non-pharmacologic comfort measures Outcome: Progressing   Problem: Activity: Goal: Risk for activity intolerance will decrease Outcome: Progressing   Problem: Coping: Goal: Level of anxiety will decrease Outcome: Progressing   Problem: Elimination: Goal: Will not experience complications related to urinary retention Outcome: Progressing   Problem: Pain Managment: Goal: General experience of comfort will improve Outcome: Progressing   Problem: Safety: Goal: Ability to remain free from injury will improve Outcome: Progressing   Problem: Skin Integrity: Goal: Risk for impaired skin integrity will decrease Outcome: Progressing   Problem: Education: Goal: Knowledge of the procedure and recovery process will improve Outcome: Progressing   Problem: Pain Management: Goal: General experience of comfort will improve Outcome: Progressing   Problem: Skin Integrity: Goal: Demonstration of wound healing without infection will improve Outcome: Progressing   Problem: Urinary Elimination: Goal: Ability to achieve and maintain urine output will improve Outcome: Progressing

## 2022-03-15 LAB — SURGICAL PATHOLOGY

## 2022-03-21 ENCOUNTER — Telehealth: Payer: Self-pay | Admitting: Genetic Counselor

## 2022-04-13 ENCOUNTER — Inpatient Hospital Stay: Payer: BC Managed Care – PPO | Attending: Genetic Counselor | Admitting: Genetic Counselor

## 2022-04-13 ENCOUNTER — Other Ambulatory Visit: Payer: Self-pay | Admitting: Genetic Counselor

## 2022-04-13 ENCOUNTER — Other Ambulatory Visit: Payer: Self-pay

## 2022-04-13 ENCOUNTER — Inpatient Hospital Stay: Payer: BC Managed Care – PPO

## 2022-04-13 ENCOUNTER — Encounter: Payer: Self-pay | Admitting: Genetic Counselor

## 2022-04-13 DIAGNOSIS — Z800 Family history of malignant neoplasm of digestive organs: Secondary | ICD-10-CM | POA: Diagnosis not present

## 2022-04-13 DIAGNOSIS — C61 Malignant neoplasm of prostate: Secondary | ICD-10-CM

## 2022-04-13 DIAGNOSIS — Z8042 Family history of malignant neoplasm of prostate: Secondary | ICD-10-CM

## 2022-04-13 DIAGNOSIS — Z803 Family history of malignant neoplasm of breast: Secondary | ICD-10-CM

## 2022-04-13 NOTE — Progress Notes (Signed)
REFERRING PROVIDER: Raynelle Bring, MD [address] [place],  [place] [address]  PRIMARY PROVIDER:  Myrlene Broker, MD  PRIMARY REASON FOR VISIT:  1. Family history of prostate cancer   2. Family history of breast cancer   3. Family history of colon cancer   4. Prostate cancer [facility])      HISTORY OF PRESENT ILLNESS:   Craig Welch, a 46 y.o. male, was seen for a [place] cancer genetics consultation at the request of Dr. Alinda Money due to a personal and family history of cancer.  Craig Welch presents to clinic today to discuss the possibility of a hereditary predisposition to cancer, genetic testing, and to further clarify his future cancer risks, as well as potential cancer risks for family members.   In June 2023, at the age of 69, Craig Welch was diagnosed with cancer of the prostate. The treatment plan included a prostatectomy.  The Gleason Score = 4 + 5.     CANCER HISTORY:  Oncology History   No history exists.    Past Medical History:  Diagnosis Date   Family history of breast cancer    Family history of colon cancer    Family history of prostate cancer    GERD (gastroesophageal reflux disease)     Past Surgical History:  Procedure Laterality Date   LYMPHADENECTOMY Bilateral 03/10/2022   Procedure: LYMPHADENECTOMY, PELVIC;  Surgeon: Raynelle Bring, MD;  Location: [facility];  Service: Urology;  Laterality: Bilateral;   ROBOT ASSISTED LAPAROSCOPIC RADICAL PROSTATECTOMY N/A 03/10/2022   Procedure: XI ROBOTIC ASSISTED LAPAROSCOPIC RADICAL PROSTATECTOMY LEVEL 2;  Surgeon: Raynelle Bring, MD;  Location: [facility];  Service: Urology;  Laterality: N/A;   tonsillecotmy      VASECTOMY      Social History   Socioeconomic History   Marital status: Married    Spouse name: Not on file   Number of children: Not on file   Years of education: Not on file   Highest education level: Not on file  Occupational History   Not on file  Tobacco Use   Smoking status: Never   Smokeless  tobacco: Never  Vaping Use   Vaping Use: Never used  Substance and Sexual Activity   Alcohol use: Never   Drug use: Never   Sexual activity: Not on file  Other Topics Concern   Not on file  Social History Narrative   Not on file   Social Determinants of Health   Financial Resource Strain: Not on file  Food Insecurity: Not on file  Transportation Needs: Not on file  Physical Activity: Not on file  Stress: Not on file  Social Connections: Not on file     FAMILY HISTORY:  We obtained a detailed, 4-generation family history.  Significant diagnoses are listed below: Family History  Problem Relation Age of Onset   Dementia Mother    Colon cancer Father 64       d. 46; exposure to agent orange   Breast cancer Paternal Aunt        dx 68s   Prostate cancer Paternal Uncle    Prostate cancer Paternal Uncle    Breast cancer Maternal Grandmother        dx 31s   Diabetes Paternal Grandmother      The patient has three sons and a daughter who are all cancer free.  He has one brother who is cancer free.  His father is deceased and his mother is living.  The patient's  father had colon cancer at 61 an died at 31.  He had three brothers and a sister.  Two brothers had prostate cancer and his sister had breast cancer.  The paternal grandparents are deceased from non-cancer related issues.  The patient's mother has dementia.  She has a maternal half brother who is cancer free.  Her mother had breast cancer in her 40's.  Craig Welch is unaware of previous family history of genetic testing for hereditary cancer risks. Patient's maternal ancestors are of [place] descent, and paternal ancestors are of [place] descent. There is no reported Ashkenazi Jewish ancestry. There is no known consanguinity.  GENETIC COUNSELING ASSESSMENT: Craig Welch is a 46 y.o. male with a personal and family history of cancer which is somewhat suggestive of a hereditary cancer syndrome and predisposition to  cancer given the combination of cancer and ages of onset. We, therefore, discussed and recommended the following at today's visit.   DISCUSSION: We discussed that, in general, most cancer is not inherited in families, but instead is sporadic or familial. Sporadic cancers occur by chance and typically happen at older ages (>50 years) as this type of cancer is caused by genetic changes acquired during an individual's lifetime. Some families have more cancers than would be expected by chance; however, the ages or types of cancer are not consistent with a known genetic mutation or known genetic mutations have been ruled out. This type of familial cancer is thought to be due to a combination of multiple genetic, environmental, hormonal, and lifestyle factors. While this combination of factors likely increases the risk of cancer, the exact source of this risk is not currently identifiable or testable.  We discussed that up to 15% of prostate cancer is hereditary, with most cases associated with BRCA mutations.  There are other genes that can be associated with hereditary prostate cancer syndromes.  These include PALB2, CHEK2 and HOXB13.  We discussed that testing is beneficial for several reasons including knowing how to follow individuals after completing their treatment, identifying whether potential treatment options such as PARP inhibitors would be beneficial, and understand if other family members could be at risk for cancer and allow them to undergo genetic testing.   We reviewed the characteristics, features and inheritance patterns of hereditary cancer syndromes. We also discussed genetic testing, including the appropriate family members to test, the process of testing, insurance coverage and turn-around-time for results. We discussed the implications of a negative, positive, carrier and/or variant of uncertain significant result. We recommended Craig Welch pursue genetic testing for the  CancerNext-Expanded+RNA gene panel.   The CancerNext-Expanded gene panel offered by [facility] and includes sequencing and rearrangement analysis for the following 77 genes: AIP, ALK, APC*, ATM*, AXIN2, BAP1, BARD1, BLM, BMPR1A, BRCA1*, BRCA2*, BRIP1*, CDC73, CDH1*, CDK4, CDKN1B, CDKN2A, CHEK2*, CTNNA1, DICER1, FANCC, FH, FLCN, GALNT12, KIF1B, LZTR1, MAX, MEN1, MET, MLH1*, MSH2*, MSH3, MSH6*, MUTYH*, NBN, NF1*, NF2, NTHL1, PALB2*, PHOX2B, PMS2*, POT1, PRKAR1A, PTCH1, PTEN*, RAD51C*, RAD51D*, RB1, RECQL, RET, SDHA, SDHAF2, SDHB, SDHC, SDHD, SMAD4, SMARCA4, SMARCB1, SMARCE1, STK11, SUFU, TMEM127, TP53*, TSC1, TSC2, VHL and XRCC2 (sequencing and deletion/duplication); EGFR, EGLN1, HOXB13, KIT, MITF, PDGFRA, POLD1, and POLE (sequencing only); EPCAM and GREM1 (deletion/duplication only). DNA and RNA analyses performed for * genes.   We discussed that some people do not want to undergo genetic testing due to fear of genetic discrimination.  A federal law called the Genetic Information Non-Discrimination Act (GINA) of 2008 helps protect individuals against genetic discrimination based on  their genetic test results.  It impacts both health insurance and employment.  With health insurance, it protects against increased premiums, being kicked off insurance or being forced to take a test in order to be insured.  For employment it protects against hiring, firing and promoting decisions based on genetic test results.  Health status due to a cancer diagnosis is not protected under GINA.   Based on Craig Welch personal and family history of cancer, he meets medical criteria for genetic testing. Despite that he meets criteria, he may still have an out of pocket cost. We discussed that if his out of pocket cost for testing is over $100, the laboratory will call and confirm whether he wants to proceed with testing.  If the out of pocket cost of testing is less than $100 he will be billed by the genetic testing laboratory.    PLAN: Despite our recommendation, Craig Welch did not wish to pursue genetic testing at today's visit. He will call us when he wants to reschedule his blood test.  We understand this decision and remain available to coordinate genetic testing at any time in the future. We, therefore, recommend Craig Welch continue to follow the cancer screening guidelines given by his primary healthcare provider.  Lastly, we encouraged Craig Welch to remain in contact with cancer genetics annually so that we can continuously update the family history and inform him of any changes in cancer genetics and testing that may be of benefit for this family.   Craig Welch questions were answered to his satisfaction today. Our contact information was provided should additional questions or concerns arise. Thank you for the referral and allowing [place] to share in the care of your patient.   Craig Welch, [place], [facility] Licensed, Insurance risk surveyor [email or web address] phone: [phone]  The patient was seen for a total of 35 minutes in face-to-face genetic counseling.  The patient was seen alone.  This patient was discussed with Drs. Magrinat, Lindi Adie and/or Burr Medico who agrees with the above.    _______________________________________________________________________ For Office Staff:  Number of people involved in session: 1 Was an Intern/ student involved with case: no

## 2023-06-11 IMAGING — MR MR PROSTATE WO/W CM
13 of 14 series · 47 of 48 positions shown · IV contrast (20 ml multihance)
Comparison: None.

CLINICAL DATA: 46-year-old male with elevated PSA.  PSA equal

EXAM:
MR PROSTATE WITHOUT AND WITH CONTRAST
TECHNIQUE: Multiplanar multisequence MRI images were obtained of the pelvis
centered about the prostate. Pre and post contrast images were
obtained.
CONTRAST:  20mL MULTIHANCE GADOBENATE DIMEGLUMINE 529 MG/ML IV SOLN

[Series 2: new loc · axial · 6.0mm · 0.88mm/px · 1 of 17 slices shown]
[im 1/17]
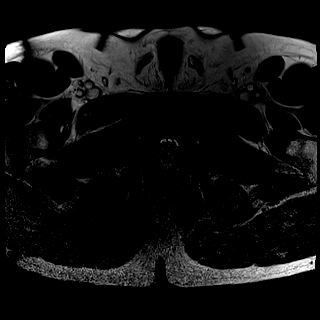

[Series 3: T2 · coronal · 3.0mm · 0.56mm/px · 1 of 23 slices shown (1 of 3)]
[im 1/23]
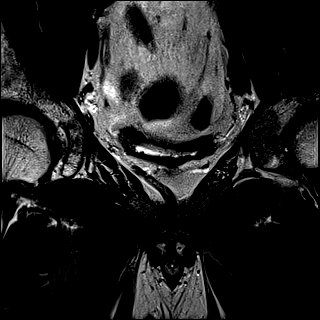

[Series 4: T1 · axial · 5.0mm · 1.25mm/px · 1 of 80 slices shown]
[im 1/80]
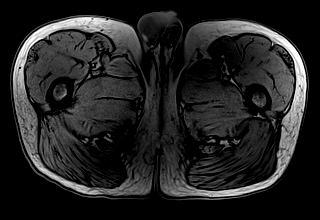

[Series 5: DWI · axial · 3.0mm · 1.75mm/px · 1 of 71 slices shown (1 of 3)]
[im 1/71]
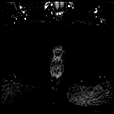

[Series 6: DWI · axial · 3.0mm · 1.75mm/px · 1 of 24 slices shown (2 of 3)]
[im 1/24]
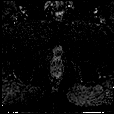

[Series 7: DWI · axial · 3.0mm · 1.75mm/px · 1 of 24 slices shown (3 of 3)]
[im 1/24]
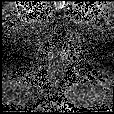

[Series 8: T2 · axial · 3.0mm · 0.56mm/px · 1 of 24 slices shown (2 of 3)]
[im 1/24]
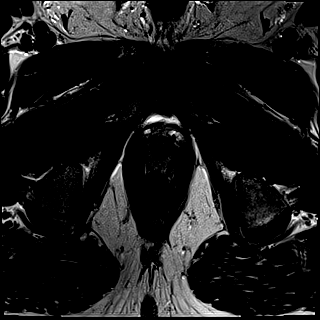

[Series 9: T2 · axial · 1.0mm · 1.04mm/px · z∈[+4,+76]mm · 2 of 72 slices shown (3 of 3)]
[im 1/72]
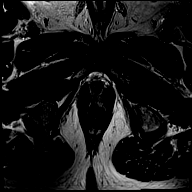
[im 72/72]
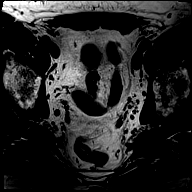

[Series 10: pre t1_twist_tra_dyn · axial · non-contrast · 3.5mm · 0.83mm/px · 1 of 20 slices shown]
[im 1/20]
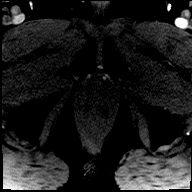

[Series 11: post t1_twist_tra_dyn-copy center · axial · non-contrast · 3.5mm · 0.83mm/px · z∈[+7,+73]mm · 17 of 600 slices shown]
[im 1/600]
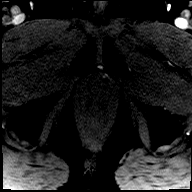
[im 38/600]
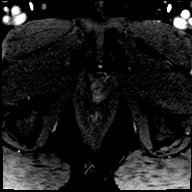
[im 75/600]
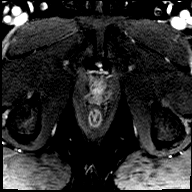
[im 113/600]
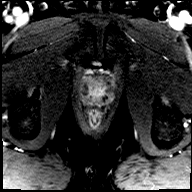
[im 150/600]
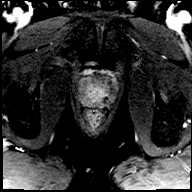
[im 188/600]
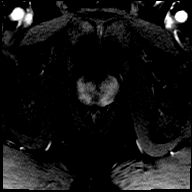
[im 225/600]
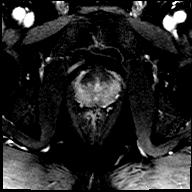
[im 263/600]
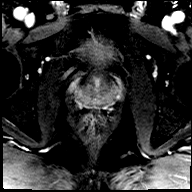
[im 300/600]
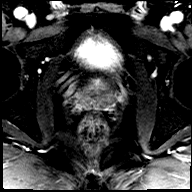
[im 337/600]
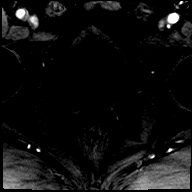
[im 375/600]
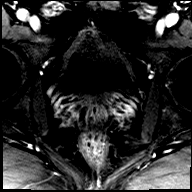
[im 412/600]
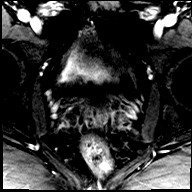
[im 450/600]
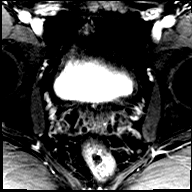
[im 487/600]
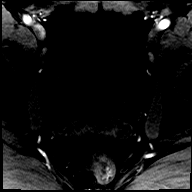
[im 525/600]
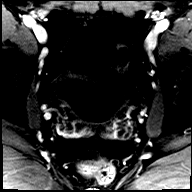
[im 562/600]
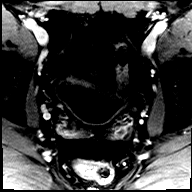
[im 600/600]
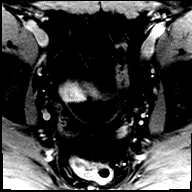

[Series 12: post t1_twist_tra_dyn-copy cent_sub · axial · 3.5mm · 0.83mm/px · z∈[+7,+73]mm · 16 of 579 slices shown]
[im 1/579]
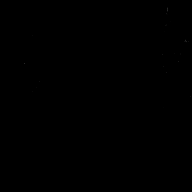
[im 39/579]
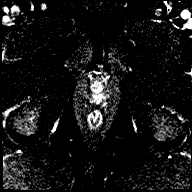
[im 78/579]
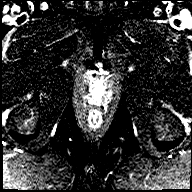
[im 116/579]
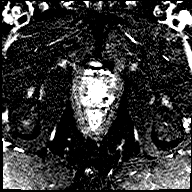
[im 155/579]
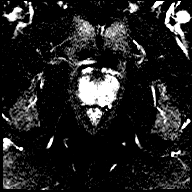
[im 193/579]
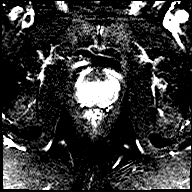
[im 232/579]
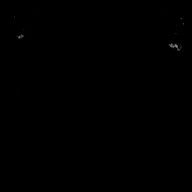
[im 270/579]
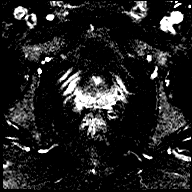
[im 309/579]
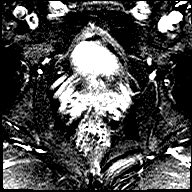
[im 347/579]
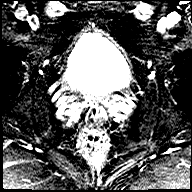
[im 386/579]
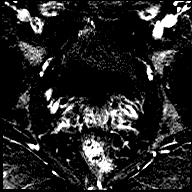
[im 424/579]
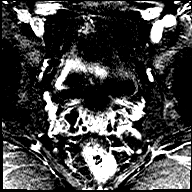
[im 463/579]
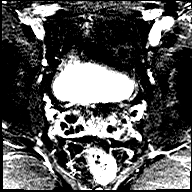
[im 501/579]
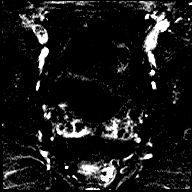
[im 540/579]
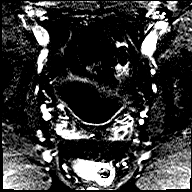
[im 579/579]
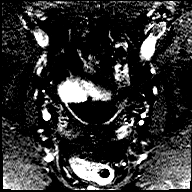

[Series 13: t1_vibe_dixon_tra_f · axial · 2.5mm · 0.91mm/px · z∈[-33,+164]mm · 2 of 80 slices shown]
[im 1/80]
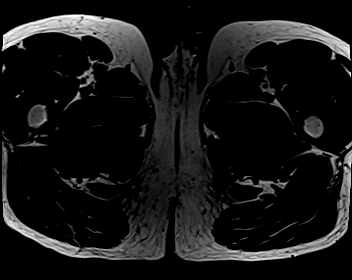
[im 80/80]
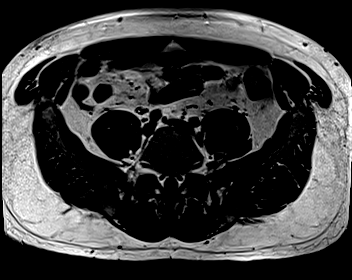

[Series 14: t1_vibe_dixon_tra_w · axial · 2.5mm · 0.91mm/px · z∈[-33,+164]mm · 2 of 80 slices shown]
[im 1/80]
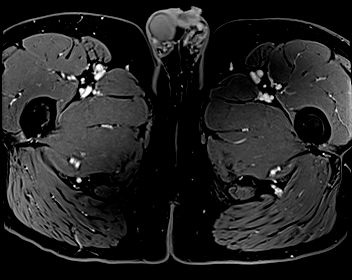
[im 80/80]
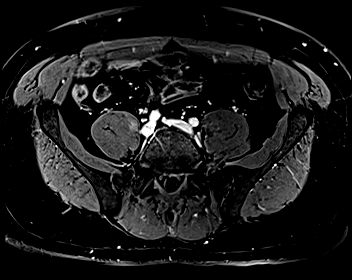

[47 of 48 positions shown; findings below may reference images not displayed]

FINDINGS: Prostate:

Peripheral zone: There is heterogeneity of signal intensity within
the peripheral zone on T2 weighted imaging. On the background of
heterogeneity, there is a focal rounded lesion in the posterior LEFT
mid gland measuring 4 mm (image 17/series 8). This lesion
corresponds to a small focus of restricted diffusion measuring 5 mm
(image 17/series 6).

No additional foci of restricted diffusion within the peripheral
zone.

Transitional zone:Transitional zone is relatively small. No
suspicious imaging characteristics on T2 weighted imaging (series

Volume: 3.2 x 3.8 x 2.9 cm (volume = 18 cm^3)

Transcapsular spread:  Absent

Seminal vesicle involvement: Absent

Neurovascular bundle involvement: Absent

Pelvic adenopathy: Absent

Bone metastasis: Absent

Other findings: None
IMPRESSION: 1. Small lesion in the posterior LEFT mid gland peripheral zone is
concerning for high-grade carcinoma. PI-RADS: 4. (Dynacad 3D post
processing performed). TG #1.
2. Small transitional zone.

## 2024-01-31 ENCOUNTER — Encounter: Payer: Self-pay | Admitting: Pediatrics

## 2024-02-28 ENCOUNTER — Ambulatory Visit (AMBULATORY_SURGERY_CENTER)

## 2024-02-28 VITALS — Ht 68.0 in | Wt 210.0 lb

## 2024-02-28 DIAGNOSIS — Z1211 Encounter for screening for malignant neoplasm of colon: Secondary | ICD-10-CM

## 2024-02-28 MED ORDER — NA SULFATE-K SULFATE-MG SULF 17.5-3.13-1.6 GM/177ML PO SOLN: 1.0000 | Freq: Once | ORAL | 0 refills | Status: AC

## 2024-02-28 NOTE — Patient Instructions (Signed)
 Stop phentermine  10 days before your procedure last dose on or before 6/16 and then hold until after your procedure

## 2024-02-28 NOTE — Progress Notes (Signed)

## 2024-03-08 ENCOUNTER — Encounter: Payer: Self-pay | Admitting: Pediatrics

## 2024-03-19 NOTE — Progress Notes (Signed)
 [facility] History and Physical   Primary Care Physician:  Silver Lamar LABOR, MD   Reason for Procedure:  Colorectal cancer screening  Plan:    Screening colonoscopy     HPI: Craig Welch is a 48 y.o. male undergoing screening colonoscopy for colorectal cancer screening.  This is the patient's first colonoscopy.  There is a pertinent family history of colorectal cancer in the patient's father at age 71.  Patient denies change in bowel habits or rectal bleeding at the time of this exam.   Past Medical History:  Diagnosis Date   Family history of breast cancer    Family history of colon cancer    Family history of prostate cancer    GERD (gastroesophageal reflux disease)     Past Surgical History:  Procedure Laterality Date   LYMPHADENECTOMY Bilateral 03/10/2022   Procedure: LYMPHADENECTOMY, PELVIC;  Surgeon: Renda Glance, MD;  Location: [facility];  Service: Urology;  Laterality: Bilateral;   ROBOT ASSISTED LAPAROSCOPIC RADICAL PROSTATECTOMY N/A 03/10/2022   Procedure: XI ROBOTIC ASSISTED LAPAROSCOPIC RADICAL PROSTATECTOMY LEVEL 2;  Surgeon: Renda Glance, MD;  Location: [facility];  Service: Urology;  Laterality: N/A;   tonsillecotmy      VASECTOMY      Prior to Admission medications   Medication Sig Start Date End Date Taking? Authorizing Provider  docusate sodium  (COLACE) 100 MG capsule Take 1 capsule (100 mg total) by mouth 2 (two) times daily. 03/10/22   Cory Palma, PA-C  omeprazole  (PRILOSEC  OTC) 20 MG tablet Take 20 mg by mouth daily.    [provider]  phentermine  37.5 MG capsule Take 37.5 mg by mouth every morning. 02/10/22   [provider]  sildenafil (VIAGRA) 100 MG tablet Take 100 mg by mouth. 03/08/22   [provider]  traMADol  (ULTRAM ) 50 MG tablet Take 1-2 tablets (50-100 mg total) by mouth every 6 (six) hours as needed for moderate pain or severe pain. Patient not taking: Reported on 02/28/2024 03/10/22   Cory Palma,  PA-C  valACYclovir (VALTREX) 500 MG tablet Take 500 mg by mouth daily. 02/09/22   [provider]    Current Outpatient Medications  Medication Sig Dispense Refill   omeprazole  (PRILOSEC  OTC) 20 MG tablet Take 20 mg by mouth daily.     valACYclovir (VALTREX) 500 MG tablet Take 500 mg by mouth daily.     docusate sodium  (COLACE) 100 MG capsule Take 1 capsule (100 mg total) by mouth 2 (two) times daily.     phentermine  37.5 MG capsule Take 37.5 mg by mouth every morning.     sildenafil (VIAGRA) 100 MG tablet Take 100 mg by mouth.     traMADol  (ULTRAM ) 50 MG tablet Take 1-2 tablets (50-100 mg total) by mouth every 6 (six) hours as needed for moderate pain or severe pain. (Patient not taking: Reported on 03/22/2024) 20 tablet 0   Current Facility-Administered Medications  Medication Dose Route Frequency Provider Last Rate Last Admin   0.9 %  sodium chloride  infusion  500 mL Intravenous Continuous Ramirez Fullbright, Inocente HERO, MD        Allergies as of 03/22/2024   (No Known Allergies)    Family History  Problem Relation Age of Onset   Dementia Mother    Colon cancer Father 67       d. 62; exposure to agent orange   Breast cancer Paternal Aunt        dx 67s   Prostate cancer Paternal Uncle  Prostate cancer Paternal Uncle    Breast cancer Maternal Grandmother        dx 29s   Diabetes Paternal Grandmother    Stomach cancer Neg Hx    Rectal cancer Neg Hx    Esophageal cancer Neg Hx     Social History   Socioeconomic History   Marital status: Married    Spouse name: Not on file   Number of children: Not on file   Years of education: Not on file   Highest education level: Not on file  Occupational History   Not on file  Tobacco Use   Smoking status: Never   Smokeless tobacco: Never  Vaping Use   Vaping status: Never Used  Substance and Sexual Activity   Alcohol use: Never   Drug use: Never   Sexual activity: Not on file  Other Topics Concern   Not on file  Social  History Narrative   Not on file   Social Drivers of Health   Financial Resource Strain: Not on file  Food Insecurity: Not on file  Transportation Needs: Not on file  Physical Activity: Not on file  Stress: Not on file  Social Connections: Not on file  Intimate Partner Violence: Not on file    Review of Systems:  All other review of systems negative except as mentioned in the HPI.  Physical Exam: Vital signs BP 120/79   Pulse 71   Temp 97.9 F (36.6 C) (Skin)   Resp 13   Ht 5' 8 (1.727 m)   Wt 210 lb (95.3 kg)   SpO2 98%   BMI 31.93 kg/m   General:   Alert,  Well-developed, well-nourished, pleasant and cooperative in NAD Airway:  Mallampati 3 Lungs:  Clear throughout to auscultation.   Heart:  Regular rate and rhythm; no murmurs, clicks, rubs,  or gallops. Abdomen:  Soft, nontender and nondistended. Normal bowel sounds.   Neuro/Psych:  Normal mood and affect. A and O x 3  Inocente Hausen, MD [facility]

## 2024-03-22 ENCOUNTER — Encounter: Payer: Self-pay | Admitting: Pediatrics

## 2024-03-22 ENCOUNTER — Ambulatory Visit: Admitting: Pediatrics

## 2024-03-22 VITALS — BP 121/86 | HR 77 | Temp 97.9°F | Resp 13 | Ht 68.0 in | Wt 210.0 lb

## 2024-03-22 DIAGNOSIS — D123 Benign neoplasm of transverse colon: Secondary | ICD-10-CM

## 2024-03-22 DIAGNOSIS — Z1211 Encounter for screening for malignant neoplasm of colon: Secondary | ICD-10-CM | POA: Diagnosis present

## 2024-03-22 DIAGNOSIS — D122 Benign neoplasm of ascending colon: Secondary | ICD-10-CM

## 2024-03-22 DIAGNOSIS — D125 Benign neoplasm of sigmoid colon: Secondary | ICD-10-CM | POA: Diagnosis not present

## 2024-03-22 MED ORDER — SODIUM CHLORIDE 0.9 % IV SOLN: 500.0000 mL | INTRAVENOUS | Status: DC

## 2024-03-22 NOTE — Progress Notes (Signed)
 Called to room to assist during endoscopic procedure.  Patient ID and intended procedure confirmed with present staff. Received instructions for my participation in the procedure from the performing physician.

## 2024-03-22 NOTE — Progress Notes (Signed)
 Report to PACU, RN, vss, BBS= Clear.

## 2024-03-22 NOTE — Patient Instructions (Signed)

## 2024-03-22 NOTE — Op Note (Signed)
 [facility] Patient Name: Craig Welch Procedure Date: 03/22/2024 8:35 AM MRN: [id number] Endoscopist: Inocente Hausen , MD, [id number] Age: 48 Referring MD:  Date of Birth: 02/02/1976 Gender: Male Account #: [id number] Procedure:                Colonoscopy Indications:              Screening patient at increased risk: Family history                            of 1st-degree relative with colorectal cancer at                            age 81 years (or older), This is the patient's                            first colonoscopy Medicines:                Monitored Anesthesia Care Procedure:                Pre-Anesthesia Assessment:                           - Prior to the procedure, a History and Physical                            was performed, and patient medications and                            allergies were reviewed. The patient's tolerance of                            previous anesthesia was also reviewed. The risks                            and benefits of the procedure and the sedation                            options and risks were discussed with the patient.                            All questions were answered, and informed consent                            was obtained. Prior Anticoagulants: The patient has                            taken no anticoagulant or antiplatelet agents. ASA                            Grade Assessment: II - A patient with mild systemic                            disease. After reviewing the risks and benefits,  the patient was deemed in satisfactory condition to                            undergo the procedure.                           After obtaining informed consent, the colonoscope                            was passed under direct vision. Throughout the                            procedure, the patient's blood pressure, pulse, and                            oxygen saturations were monitored  continuously. The                            Olympus Scope [id number] was introduced through the                            anus and advanced to the cecum, identified by                            appendiceal orifice and ileocecal valve. The                            colonoscopy was performed without difficulty. The                            patient tolerated the procedure well. The quality                            of the bowel preparation was good. The ileocecal                            valve, appendiceal orifice, and rectum were                            photographed. Scope In: 8:43:10 AM Scope Out: 9:02:28 AM Scope Withdrawal Time: 0 hours 15 minutes 55 seconds  Total Procedure Duration: 0 hours 19 minutes 18 seconds  Findings:                 The perianal and digital rectal examinations were                            normal. Pertinent negatives include normal                            sphincter tone and no palpable rectal lesions.                           Two sessile polyps were found in the sigmoid colon  and ascending colon. The polyps were 5 to 7 mm in                            size. These polyps were removed with a cold snare.                            Resection and retrieval were complete.                           Six sessile polyps were found in the sigmoid colon,                            transverse colon and ascending colon. The polyps                            were 3 to 4 mm in size. These polyps were removed                            with a cold biopsy forceps. Resection and retrieval                            were complete.                           The retroflexed view of the distal rectum and anal                            verge was normal and showed no anal or rectal                            abnormalities. Complications:            No immediate complications. Estimated blood loss:                             Minimal. Estimated Blood Loss:     Estimated blood loss was minimal. Impression:               - Two 5 to 7 mm polyps in the sigmoid colon and in                            the ascending colon, removed with a cold snare.                            Resected and retrieved.                           - Six 3 to 4 mm polyps in the sigmoid colon, in the                            transverse colon and in the ascending colon,  removed with a cold biopsy forceps. Resected and                            retrieved. Recommendation:           - Discharge patient to home (ambulatory).                           - Await pathology results.                           - Repeat colonoscopy for surveillance based on                            pathology results.                           - The findings and recommendations were discussed                            with the patient's family.                           - Return to referring physician.                           - Patient has a contact number available for                            emergencies. The signs and symptoms of potential                            delayed complications were discussed with the                            patient. Return to normal activities tomorrow.                            Written discharge instructions were provided to the                            patient. Inocente Hausen, MD 03/22/2024 9:07:33 AM This report has been signed electronically.

## 2024-03-25 ENCOUNTER — Telehealth: Payer: Self-pay

## 2024-03-25 NOTE — Telephone Encounter (Signed)
  Follow up Call-     03/22/2024    8:02 AM  Call back number  Post procedure Call Back phone  # [phone]  Permission to leave phone message Yes     Patient questions:  Do you have a fever, pain , or abdominal swelling? No. Pain Score  0 *  Have you tolerated food without any problems? Yes.    Have you been able to return to your normal activities? Yes.    Do you have any questions about your discharge instructions: Diet   No. Medications  No. Follow up visit  No.  Do you have questions or concerns about your Care? No.  Actions: * If pain score is 4 or above: No action needed, pain <4.

## 2024-03-28 LAB — SURGICAL PATHOLOGY

## 2024-03-30 ENCOUNTER — Ambulatory Visit: Payer: Self-pay | Admitting: Pediatrics

## 2024-05-06 NOTE — Progress Notes (Deleted)
 [facility] Return Visit   Referring Provider Silver Lamar LABOR, MD [address] [place],  [place] [address]  Primary Care Provider Silver Lamar LABOR, MD  Patient Profile: Craig Welch is a 48 y.o. male with a past medical history noteworthy for migraine headaches, abdominal hernia, vitamin B12 deficiency, depression who returns to the [facility] for follow-up of the problem(s) noted below.  Problem List: History of colon tubular adenomas Family history of colorectal cancer in a first-degree relative-father diagnosed age 65   History of Present Illness   Craig Welch was last seen in the GI office at the time of recent colonoscopy 03/22/2024   Current GI Meds    Interval History   Discussed the use of AI scribe software for clinical note transcription with the patient, who gave verbal consent to proceed.  History of Present Illness      GI Review of Symptoms Significant for {[id number]}. Otherwise negative.  General Review of Systems  Review of systems is significant for the pertinent positives and negatives as listed per the HPI.  Full ROS is otherwise negative.  Past Medical History   Past Medical History:  Diagnosis Date   Family history of breast cancer    Family history of colon cancer    Family history of prostate cancer    GERD (gastroesophageal reflux disease)      Past Surgical History   Past Surgical History:  Procedure Laterality Date   LYMPHADENECTOMY Bilateral 03/10/2022   Procedure: LYMPHADENECTOMY, PELVIC;  Surgeon: Renda Glance, MD;  Location: [facility];  Service: Urology;  Laterality: Bilateral;   ROBOT ASSISTED LAPAROSCOPIC RADICAL PROSTATECTOMY N/A 03/10/2022   Procedure: XI ROBOTIC ASSISTED LAPAROSCOPIC RADICAL PROSTATECTOMY LEVEL 2;  Surgeon: Renda Glance, MD;  Location: [facility];  Service: Urology;  Laterality: N/A;   tonsillecotmy      VASECTOMY       Allergies and Medications   No Known  Allergies @MEDSTODAY @  Family His   Family History  Problem Relation Age of Onset   Dementia Mother    Colon cancer Father 59       d. 50; exposure to agent orange   Breast cancer Paternal Aunt        dx 46s   Prostate cancer Paternal Uncle    Prostate cancer Paternal Uncle    Breast cancer Maternal Grandmother        dx 76s   Diabetes Paternal Grandmother    Stomach cancer Neg Hx    Rectal cancer Neg Hx    Esophageal cancer Neg Hx    GI Specific Family History: {gifamhx:50061}   Social History   Social History   Tobacco Use   Smoking status: Never   Smokeless tobacco: Never  Vaping Use   Vaping status: Never Used  Substance Use Topics   Alcohol use: Never   Drug use: Never   Surya reports that he has never smoked. He has never used smokeless tobacco. He reports that he does not drink alcohol and does not use drugs.  Vital Signs and Physical Examination   There were no vitals filed for this visit. There is no height or weight on file to calculate BMI.    General: Well developed, well nourished, no acute distress Head: Normocephalic and atraumatic Eyes: Sclerae anicteric, EOMI Ears: Normal auditory acuity Mouth: No deformities or lesions noted Lungs: Clear throughout to auscultation Heart: Regular rate and rhythm; No murmurs, rubs or bruits Abdomen: Soft, non tender and non  distended. No masses, hepatosplenomegaly or hernias noted. Normal Bowel sounds Rectal: Musculoskeletal: Symmetrical with no gross deformities  Pulses:  Normal pulses noted Extremities: No edema or deformities noted Neurological: Alert oriented x 4, grossly nonfocal Psychological:  Alert and cooperative. Normal mood and affect   Review of Data   The following data was reviewed at the time of this encounter:   Laboratory Studies      Latest Ref Rng & Units 03/11/2022    3:43 AM 03/10/2022   10:33 AM 03/01/2022    3:08 PM  CBC  WBC 4.0 - 10.5 K/uL   6.4   Hemoglobin 13.0 - 17.0  g/dL 85.0  85.5  85.6   Hematocrit 39.0 - 52.0 % 43.7  41.2  40.7   Platelets 150 - 400 K/uL   255     No results found for: LIPASE    Latest Ref Rng & Units 03/01/2022    3:08 PM  CMP  Glucose 70 - 99 mg/dL 90   BUN 6 - 20 mg/dL 12   Creatinine 9.38 - 1.24 mg/dL 8.98   Sodium 864 - 854 mmol/L 141   Potassium 3.5 - 5.1 mmol/L 4.0   Chloride 98 - 111 mmol/L 109   CO2 22 - 32 mmol/L 25   Calcium 8.9 - 10.3 mg/dL 9.1      Imaging Studies    GI Procedures and Studies   Colonoscopy 03/22/2024 - Two 5 to 7 mm polyps in the sigmoid colon and in the ascending colon, removed with a cold snare. Resected and retrieved.  - Six 3 to 4 mm polyps in the sigmoid colon, in the transverse colon and in the ascending colon, removed with a cold biopsy forceps. Resected and retrieved. Path: Tubular adenomas  Clinical Impression  It is my clinical impression that Craig Welch is a 48 y.o. male with;  ***  Plan  *** *** *** *** ***   Planned Follow Up No follow-ups on file.  The patient or caregiver verbalized understanding of the material covered, with no barriers to understanding. All questions were answered. Patient or caregiver is agreeable with the plan outlined above.    It was a pleasure to see Craig Welch.  If you have any questions or concerns regarding this evaluation, do not hesitate to contact me.  Inocente Hausen, MD [facility]

## 2024-05-07 ENCOUNTER — Ambulatory Visit: Admitting: Pediatrics
